# Patient Record
Sex: Female | Born: 1995 | Race: White | Hispanic: No | Marital: Single | Smoking: Current every day smoker
Health system: Southern US, Community
[De-identification: ages and names within clinical notes are randomized; demographics above are authoritative.]

## PROBLEM LIST (undated history)

## (undated) DIAGNOSIS — F988 Other specified behavioral and emotional disorders with onset usually occurring in childhood and adolescence: Secondary | ICD-10-CM

## (undated) DIAGNOSIS — K589 Irritable bowel syndrome without diarrhea: Secondary | ICD-10-CM

## (undated) HISTORY — DX: Irritable bowel syndrome, unspecified: K58.9

## (undated) HISTORY — DX: Other specified behavioral and emotional disorders with onset usually occurring in childhood and adolescence: F98.8

## (undated) HISTORY — PX: OTHER SURGICAL HISTORY: SHX169

---
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
## 2015-01-02 ENCOUNTER — Ambulatory Visit: Payer: Medicaid Other | Admitting: Physical Therapy

## 2015-01-10 ENCOUNTER — Ambulatory Visit: Payer: Medicaid Other | Admitting: Physical Therapy

## 2015-12-03 ENCOUNTER — Encounter: Payer: Self-pay | Admitting: Family Medicine

## 2015-12-03 ENCOUNTER — Ambulatory Visit (INDEPENDENT_AMBULATORY_CARE_PROVIDER_SITE_OTHER): Payer: Medicaid Other | Admitting: Family Medicine

## 2015-12-03 ENCOUNTER — Ambulatory Visit (INDEPENDENT_AMBULATORY_CARE_PROVIDER_SITE_OTHER): Payer: Medicaid Other

## 2015-12-03 VITALS — BP 122/70 | HR 87 | Temp 98.7°F | Ht 64.0 in | Wt 227.0 lb

## 2015-12-03 DIAGNOSIS — M25521 Pain in right elbow: Secondary | ICD-10-CM

## 2015-12-03 NOTE — Progress Notes (Signed)
   Subjective:  Patient ID: Kelly Morrison, female    DOB: 1996/01/30  Age: 20 y.o. MRN: [id number]  CC: Establish Care and Elbow Pain   HPI SHERRELLE PROCHAZKA presents for fell backwards while bending to take clothes out of the dryer. Fell backwards. Hit elbow. Onset yesterday. Very tender. Knot forming. Pain 5/10 at posterior elbow, hurts with flexion & extension. Moderate relief using ice packs. Did not take meds such as OTC analgesia.  History Kyli has a past medical history of Attention deficit disorder.   She has past surgical history that includes Knee arthroscopy with posterior cruciate ligament (pcl) reconstruction (Right, 2012); Tonsillectomy (2016); and attention deficit disoder.   Her family history includes ADD / ADHD in her brother; Cancer in her brother and father; Depression in her brother; Stroke in her mother; Tourette syndrome in her brother.She reports that she has been passively smoking.  She does not have any smokeless tobacco history on file. She reports that she does not drink alcohol or use illicit drugs.  Currently takes no medications   ROS Review of Systems  Constitutional: Negative for fever, activity change and appetite change.  HENT: Negative for congestion, rhinorrhea and sore throat.   Eyes: Negative for visual disturbance.  Respiratory: Negative for cough and shortness of breath.   Cardiovascular: Negative for chest pain and palpitations.  Gastrointestinal: Negative for nausea, abdominal pain and diarrhea.  Genitourinary: Negative for dysuria.  Musculoskeletal: Positive for joint swelling and arthralgias. Negative for myalgias.       Nml except elbow - see HPI. Previous knee injury has resolved    Objective:  BP 122/70 mmHg  Pulse 87  Temp(Src) 98.7 F (37.1 C) (Oral)  Ht  (1.626 m)  Wt 227 lb (102.967 kg)  BMI 38.95 kg/m2  SpO2 98%  LMP 08/02/2015 (Approximate)  Physical Exam  Constitutional: She is oriented to person, place, and  time. She appears well-developed and well-nourished. No distress.  HENT:  Head: Normocephalic and atraumatic.  Eyes: Conjunctivae are normal. Pupils are equal, round, and reactive to light.  Neck: Normal range of motion. Neck supple. No thyromegaly present.  Cardiovascular: Normal rate, regular rhythm and normal heart sounds.   No murmur heard. Pulmonary/Chest: Effort normal and breath sounds normal. No respiratory distress. She has no wheezes. She has no rales.  Abdominal: Soft. Bowel sounds are normal. She exhibits no distension. There is no tenderness.  Musculoskeletal: Normal range of motion. She exhibits edema and tenderness (posterior right elbow).  Lymphadenopathy:    She has no cervical adenopathy.  Neurological: She is alert and oriented to person, place, and time.  Skin: Skin is warm and dry.  Psychiatric: She has a normal mood and affect. Her behavior is normal. Judgment and thought content normal.    Assessment & Plan:   Ayannah was seen today for establish care and elbow pain.  Diagnoses and all orders for this visit:  Elbow pain, right -     DG Elbow 2 Views Right; Future -     POCT urine pregnancy   I am having Ms. Steen maintain her etonogestrel and ibuprofen.  Meds ordered this encounter  Medications  . etonogestrel (IMPLANON) 68 MG IMPL implant    Sig: by Subdermal route.  Marland Kitchen ibuprofen (ADVIL,MOTRIN) 400 MG tablet    Sig: Take 400 mg by mouth.   XR - no fx noted.  Follow-up: Return if symptoms worsen or fail to improve.  Mechele Claude, M.D.

## 2015-12-13 ENCOUNTER — Ambulatory Visit (INDEPENDENT_AMBULATORY_CARE_PROVIDER_SITE_OTHER): Payer: Medicaid Other | Admitting: Family Medicine

## 2015-12-13 ENCOUNTER — Encounter: Payer: Self-pay | Admitting: Family Medicine

## 2015-12-13 VITALS — BP 130/82 | HR 94 | Temp 98.0°F | Ht 64.0 in | Wt 228.8 lb

## 2015-12-13 DIAGNOSIS — Z3009 Encounter for other general counseling and advice on contraception: Secondary | ICD-10-CM

## 2015-12-13 DIAGNOSIS — N926 Irregular menstruation, unspecified: Secondary | ICD-10-CM

## 2015-12-13 NOTE — Patient Instructions (Signed)
Great to meet you!  Come back anytime.   Consider oral contraceptive pills when your nexplanon is finished (it lasts 3 years), [facility] will be able to tell you the date to get it removed.

## 2015-12-13 NOTE — Progress Notes (Signed)
   HPI  Patient presents today here to discuss irregular periods.  She explains that since getting her Nexplanon replaced about a year and a half ago she's had irregular periods. This is her second one, on the first she had regular periods She sexually active with one partner, does not use condoms due to latex allergy and the expense of nonlatex condoms.  She denies any concerns for STI's. She is engaged remarried.  She has questions about which contraception methods to use whenever her Medicaid runs out.  She is considering getting it removed b/c she is worried about teh cost of removal w/o insurance  She had a negative preg test at the [facility] yesterday  PMH: Smoking status noted ROS: Per HPI  Objective: BP 130/82 mmHg  Pulse 94  Temp(Src) 98 F (36.7 C) (Oral)  Ht  (1.626 m)  Wt 228 lb 12.8 oz (103.783 kg)  BMI 39.25 kg/m2  LMP 09/27/2015 Gen: NAD, alert, cooperative with exam HEENT: NCAT CV: RRR, good S1/S2, no murmur Resp: CTABL, no wheezes, non-labored Neuro: Alert and oriented, No gross deficits  Assessment and plan:  # Irregular periods, discusion about contraception Irregularity likely due to nexplanon Recommended discussing cost with [facility]. If she would like it removed shile she is covered I would be glad to do this and offer an Rx of sprintec.  Follow up as needed   Murtis Sink, MD [facility] 12/13/2015, 8:50 AM

## 2016-02-26 ENCOUNTER — Ambulatory Visit: Payer: Medicaid Other | Admitting: Family Medicine

## 2016-02-26 ENCOUNTER — Encounter (INDEPENDENT_AMBULATORY_CARE_PROVIDER_SITE_OTHER): Payer: Self-pay

## 2016-02-27 ENCOUNTER — Encounter: Payer: Self-pay | Admitting: Family Medicine

## 2016-03-18 ENCOUNTER — Ambulatory Visit (INDEPENDENT_AMBULATORY_CARE_PROVIDER_SITE_OTHER): Payer: Medicaid Other | Admitting: Physician Assistant

## 2016-03-18 ENCOUNTER — Encounter: Payer: Self-pay | Admitting: Physician Assistant

## 2016-03-18 VITALS — BP 112/67 | HR 87 | Temp 99.1°F | Ht 64.0 in | Wt 232.8 lb

## 2016-03-18 DIAGNOSIS — M545 Low back pain, unspecified: Secondary | ICD-10-CM

## 2016-03-18 DIAGNOSIS — M542 Cervicalgia: Secondary | ICD-10-CM | POA: Diagnosis not present

## 2016-03-18 MED ORDER — CYCLOBENZAPRINE HCL 10 MG PO TABS: 10.0000 mg | ORAL_TABLET | Freq: Three times a day (TID) | ORAL | Status: DC | PRN

## 2016-03-18 MED ORDER — MELOXICAM 15 MG PO TABS: 15.0000 mg | ORAL_TABLET | Freq: Every day | ORAL | Status: DC

## 2016-03-18 NOTE — Progress Notes (Signed)
Subjective:     Patient ID: Kelly Morrison, female   DOB: 06/30/1996, 20 y.o.   MRN: [id number]  HPI Pt slipped in th shower last pm Denies any LOC with the fall Pt with midline neck and low back pain Took one of her fathers 800mg  Ibuprofen Still with pain this am and needs note for work  Review of Systems  Denies any radiation of sx to the ext. No numbness or tingling noted    Objective:   Physical Exam Gait nl NAD FROM  of the C-spine but sx at all ext Shoulder shrug equal FROM of the UE ext  Strength to the upper ext equal bilat Pulses sensory good to UE + TTP of the post cerv region manly midline No palp muscle spasm FROM of the L-spine Sx at the ext Generalized TTP of the entire L-spine area No palp muscle spasm SLR neg Strength equal in the lower ext Sensory good DTR1+/= upper and lower ext    Assessment:     1. Midline low back pain without sciatica   2. Neck pain        Plan:     Heat/Ice Gentle stretching Massage Nl course reviewed Work note Mobic 15 mg q d x 10 days Flexeril 10mg  tid prn #21 SE reviewed F/U prn

## 2016-03-18 NOTE — Patient Instructions (Signed)

## 2016-03-26 ENCOUNTER — Encounter: Payer: Self-pay | Admitting: Family

## 2016-03-26 ENCOUNTER — Ambulatory Visit (INDEPENDENT_AMBULATORY_CARE_PROVIDER_SITE_OTHER): Payer: Medicaid Other | Admitting: Family

## 2016-03-26 VITALS — BP 132/89 | HR 110 | Temp 98.5°F | Ht 64.0 in | Wt 236.0 lb

## 2016-03-26 DIAGNOSIS — A084 Viral intestinal infection, unspecified: Secondary | ICD-10-CM | POA: Diagnosis not present

## 2016-03-26 DIAGNOSIS — Z6841 Body Mass Index (BMI) 40.0 and over, adult: Secondary | ICD-10-CM

## 2016-03-26 MED ORDER — ONDANSETRON HCL 4 MG PO TABS: 4.0000 mg | ORAL_TABLET | Freq: Three times a day (TID) | ORAL | Status: DC | PRN

## 2016-03-26 NOTE — Progress Notes (Signed)
   Subjective:    Patient ID: Kelly Morrison, female    DOB: 02/02/1996, 20 y.o.   MRN: [id number]  Emesis  This is a new problem. The current episode started in the past 7 days. The problem occurs 2 to 4 times per day. The problem has been unchanged. The emesis has an appearance of stomach contents. There has been no fever. Associated symptoms include chills, coughing, diarrhea, dizziness and headaches. Pertinent negatives include no chest pain or fever. She has tried bed rest and increased fluids for the symptoms. The treatment provided mild relief.  Diarrhea  This is a new problem. The current episode started in the past 7 days. The problem occurs 2 to 4 times per day. The problem has been unchanged. The stool consistency is described as watery. The patient states that diarrhea does not awaken her from sleep. Associated symptoms include chills, coughing, headaches and vomiting. Pertinent negatives include no bloating, fever or increased  flatus. Nothing aggravates the symptoms. She has tried increased fluids for the symptoms. The treatment provided mild relief.      Review of Systems  Constitutional: Positive for chills. Negative for fever.  Respiratory: Positive for cough.   Cardiovascular: Negative for chest pain.  Gastrointestinal: Positive for vomiting and diarrhea. Negative for bloating and flatus.  Neurological: Positive for dizziness and headaches.  All other systems reviewed and are negative.      Objective:   Physical Exam  Constitutional: She is oriented to person, place, and time. She appears well-developed and well-nourished. No distress.  HENT:  Head: Normocephalic and atraumatic.  Right Ear: External ear normal.  Left Ear: External ear normal.  Nose: Nose normal.  Mouth/Throat: Oropharynx is clear and moist.  Eyes: Pupils are equal, round, and reactive to light.  Neck: Normal range of motion. Neck supple. No thyromegaly present.  Cardiovascular: Normal rate, regular  rhythm, normal heart sounds and intact distal pulses.   No murmur heard. Pulmonary/Chest: Effort normal and breath sounds normal. No respiratory distress. She has no wheezes.  Abdominal: Soft. Bowel sounds are normal. She exhibits no distension. There is no tenderness.  Musculoskeletal: Normal range of motion. She exhibits no edema or tenderness.  Neurological: She is alert and oriented to person, place, and time. She has normal reflexes. No cranial nerve deficit.  Skin: Skin is warm and dry.  Psychiatric: She has a normal mood and affect. Her behavior is normal. Judgment and thought content normal.  Vitals reviewed.     BP 132/89 mmHg  Pulse 110  Temp(Src) 98.5 F (36.9 C) (Oral)  Ht 5\' 4"  (1.626 m)  Wt 236 lb (107.049 kg)  BMI 40.49 kg/m2  LMP  (Approximate)     Assessment & Plan:  1. Viral gastroenteritis -Force fluids -Bland diet -Zofran as needed, Imodium as needed RTO prn  - ondansetron (ZOFRAN) 4 MG tablet; Take 1 tablet (4 mg total) by mouth every 8 (eight) hours as needed for nausea or vomiting.  Dispense: 20 tablet; Refill: 0  2. Morbid obesity with BMI of 40.0-44.9, adult (HCC)  Jannifer Rodneyhristy Hazyl Marseille, FNP

## 2016-03-26 NOTE — Patient Instructions (Signed)

## 2016-04-22 ENCOUNTER — Ambulatory Visit (INDEPENDENT_AMBULATORY_CARE_PROVIDER_SITE_OTHER): Payer: Medicaid Other | Admitting: Physician Assistant

## 2016-04-22 ENCOUNTER — Encounter: Payer: Self-pay | Admitting: Physician Assistant

## 2016-04-22 VITALS — BP 123/83 | HR 81 | Temp 97.6°F | Ht 64.0 in | Wt 233.0 lb

## 2016-04-22 DIAGNOSIS — R5383 Other fatigue: Secondary | ICD-10-CM

## 2016-04-22 DIAGNOSIS — R358 Other polyuria: Secondary | ICD-10-CM | POA: Diagnosis not present

## 2016-04-22 DIAGNOSIS — R3589 Other polyuria: Secondary | ICD-10-CM

## 2016-04-22 NOTE — Progress Notes (Signed)
Subjective:     Patient ID: Kelly Morrison, female   DOB: 03/09/1996, 20 y.o.   MRN: [id number]  HPI Pt states she is having pregnancy sx of wgt gain, polyuria, nausea, and fatigue Taken several OTC preg test that have been indeterminate She has had Nexplanon for 2 yrs There is a FH of diabetes  Review of Systems  Constitutional: Positive for activity change, fatigue and unexpected weight change. Negative for fever, chills, diaphoresis and appetite change.  HENT: Negative.   Respiratory: Negative.   Cardiovascular: Negative.   Gastrointestinal: Positive for nausea and abdominal pain. Negative for vomiting, diarrhea and constipation.  Endocrine: Positive for polyuria. Negative for polydipsia.  Genitourinary: Positive for frequency. Negative for dysuria, urgency, hematuria, flank pain, decreased urine volume, vaginal discharge and difficulty urinating.       Objective:   Physical Exam  Constitutional: She appears well-developed and well-nourished.  HENT:  Right Ear: External ear normal.  Left Ear: External ear normal.  Nose: Nose normal.  Mouth/Throat: Oropharynx is clear and moist. No oropharyngeal exudate.  Neck: Normal range of motion. Neck supple. No thyromegaly present.  Cardiovascular: Normal rate, regular rhythm and normal heart sounds.   Pulmonary/Chest: Effort normal and breath sounds normal.  Abdominal: Soft. Bowel sounds are normal. She exhibits no distension and no mass. There is no tenderness. There is no rebound and no guarding.  Lymphadenopathy:    She has no cervical adenopathy.  Nursing note and vitals reviewed. CMP.CBC.Serum preg pending     Assessment:     1. Other fatigue   2. Polyuria        Plan:     Labs pending- will inform of results F/U pending labs Good diet Rest

## 2016-04-22 NOTE — Patient Instructions (Signed)
Fatigue  Fatigue is feeling tired all of the time, a lack of energy, or a lack of motivation. Occasional or mild fatigue is often a normal response to activity or life in general. However, long-lasting (chronic) or extreme fatigue may indicate an underlying medical condition.  HOME CARE INSTRUCTIONS   Watch your fatigue for any changes. The following actions may help to lessen any discomfort you are feeling:  · Talk to your health care provider about how much sleep you need each night. Try to get the required amount every night.  · Take medicines only as directed by your health care provider.  · Eat a healthy and nutritious diet. Ask your health care provider if you need help changing your diet.  · Drink enough fluid to keep your urine clear or pale yellow.  · Practice ways of relaxing, such as yoga, meditation, massage therapy, or acupuncture.  · Exercise regularly.    · Change situations that cause you stress. Try to keep your work and personal routine reasonable.  · Do not abuse illegal drugs.  · Limit alcohol intake to no more than 1 drink per day for nonpregnant women and 2 drinks per day for men. One drink equals 12 ounces of beer, 5 ounces of wine, or 1½ ounces of hard liquor.  · Take a multivitamin, if directed by your health care provider.  SEEK MEDICAL CARE IF:   · Your fatigue does not get better.  · You have a fever.    · You have unintentional weight loss or gain.  · You have headaches.    · You have difficulty:      Falling asleep.    Sleeping throughout the night.  · You feel angry, guilty, anxious, or sad.     · You are unable to have a bowel movement (constipation).    · You skin is dry.     · Your legs or another part of your body is swollen.    SEEK IMMEDIATE MEDICAL CARE IF:   · You feel confused.    · Your vision is blurry.  · You feel faint or pass out.    · You have a severe headache.    · You have severe abdominal, pelvic, or back pain.    · You have chest pain, shortness of breath, or an  irregular or fast heartbeat.    · You are unable to urinate or you urinate less than normal.    · You develop abnormal bleeding, such as bleeding from the rectum, vagina, nose, lungs, or nipples.  · You vomit blood.     · You have thoughts about harming yourself or committing suicide.    · You are worried that you might harm someone else.       This information is not intended to replace advice given to you by your health care provider. Make sure you discuss any questions you have with your health care provider.     Document Released: 08/16/2007 Document Revised: 11/09/2014 Document Reviewed: 02/20/2014  Elsevier Interactive Patient Education ©2016 [employer]

## 2016-04-23 LAB — CMP14+EGFR
A/G RATIO: 1.7 (ref 1.2–2.2)
ALBUMIN: 4.3 g/dL (ref 3.5–5.5)
ALK PHOS: 83 IU/L (ref 39–117)
ALT: 17 IU/L (ref 0–32)
AST: 17 IU/L (ref 0–40)
BILIRUBIN TOTAL: 0.4 mg/dL (ref 0.0–1.2)
BUN / CREAT RATIO: 15 (ref 9–23)
BUN: 13 mg/dL (ref 6–20)
CHLORIDE: 102 mmol/L (ref 96–106)
CO2: 24 mmol/L (ref 18–29)
Calcium: 9.1 mg/dL (ref 8.7–10.2)
Creatinine, Ser: 0.85 mg/dL (ref 0.57–1.00)
GFR calc non Af Amer: 99 mL/min/{1.73_m2} (ref >59)
GFR, EST AFRICAN AMERICAN: 114 mL/min/{1.73_m2} (ref >59)
GLOBULIN, TOTAL: 2.6 g/dL (ref 1.5–4.5)
GLUCOSE: 96 mg/dL (ref 65–99)
POTASSIUM: 4.3 mmol/L (ref 3.5–5.2)
SODIUM: 142 mmol/L (ref 134–144)
Total Protein: 6.9 g/dL (ref 6.0–8.5)

## 2016-04-23 LAB — CBC WITH DIFFERENTIAL/PLATELET
BASOS ABS: 0 10*3/uL (ref 0.0–0.2)
BASOS: 1 %
EOS (ABSOLUTE): 0.1 10*3/uL (ref 0.0–0.4)
Eos: 2 %
Hematocrit: 40.8 % (ref 34.0–46.6)
Hemoglobin: 13.4 g/dL (ref 11.1–15.9)
Immature Grans (Abs): 0 10*3/uL (ref 0.0–0.1)
Immature Granulocytes: 0 %
LYMPHS ABS: 2.2 10*3/uL (ref 0.7–3.1)
Lymphs: 36 %
MCH: 28.5 pg (ref 26.6–33.0)
MCHC: 32.8 g/dL (ref 31.5–35.7)
MCV: 87 fL (ref 79–97)
Monocytes Absolute: 0.6 10*3/uL (ref 0.1–0.9)
Monocytes: 9 %
NEUTROS ABS: 3.3 10*3/uL (ref 1.4–7.0)
Neutrophils: 52 %
PLATELETS: 273 10*3/uL (ref 150–379)
RBC: 4.7 x10E6/uL (ref 3.77–5.28)
RDW: 13.3 % (ref 12.3–15.4)
WBC: 6.3 10*3/uL (ref 3.4–10.8)

## 2016-04-23 LAB — BETA HCG QUANT (REF LAB)

## 2016-08-05 ENCOUNTER — Encounter: Payer: Self-pay | Admitting: Family Medicine

## 2016-08-05 ENCOUNTER — Ambulatory Visit (INDEPENDENT_AMBULATORY_CARE_PROVIDER_SITE_OTHER): Payer: Medicaid Other | Admitting: Family Medicine

## 2016-08-05 ENCOUNTER — Ambulatory Visit (INDEPENDENT_AMBULATORY_CARE_PROVIDER_SITE_OTHER): Payer: Medicaid Other

## 2016-08-05 VITALS — BP 118/73 | HR 85 | Temp 98.2°F | Ht 64.0 in | Wt 239.6 lb

## 2016-08-05 DIAGNOSIS — R1030 Lower abdominal pain, unspecified: Secondary | ICD-10-CM | POA: Diagnosis not present

## 2016-08-05 LAB — URINALYSIS, COMPLETE
Bilirubin, UA: NEGATIVE
GLUCOSE, UA: NEGATIVE
Ketones, UA: NEGATIVE
Nitrite, UA: NEGATIVE
PROTEIN UA: NEGATIVE
RBC, UA: NEGATIVE
Specific Gravity, UA: >1.03 — ABNORMAL HIGH (ref 1.005–1.030)
Urobilinogen, Ur: 0.2 mg/dL (ref 0.2–1.0)
pH, UA: 6 (ref 5.0–7.5)

## 2016-08-05 LAB — MICROSCOPIC EXAMINATION: Epithelial Cells (non renal): >10 /hpf — AB (ref 0–10)

## 2016-08-05 MED ORDER — POLYETHYLENE GLYCOL 3350 17 GM/SCOOP PO POWD: 17.0000 g | Freq: Two times a day (BID) | ORAL | 5 refills | Status: DC | PRN

## 2016-08-05 NOTE — Progress Notes (Signed)
Subjective:  Patient ID: Kelly Morrison, female    DOB: 10/11/1996  Age: 20 y.o. MRN: [id number]  CC: lower abdominal pain and Nausea   HPI Kelly Morrison presents for 3-4 days of increasing bilateral lower quadrant pain. She denies any vaginal discharge. She is sexually active but has an Implanon implant. Because of that her periods happen irregularly usually about once every 6 months. The most recent one was about 3 months ago. Additionally she denies any frequency of urination or dysuria.  History Kelly Morrison has a past medical history of Attention deficit disorder.   She has a past surgical history that includes Knee arthroscopy with posterior cruciate ligament (pcl) reconstruction (Right, 2012); Tonsillectomy (2016); and attention deficit disoder.   Her family history includes ADD / ADHD in her brother; Cancer in her brother and father; Depression in her brother; Stroke in her mother; Tourette syndrome in her brother.She reports that she is a non-smoker but has been exposed to tobacco smoke. She has never used smokeless tobacco. She reports that she does not drink alcohol or use drugs.  Current Outpatient Prescriptions on File Prior to Visit  Medication Sig Dispense Refill  . etonogestrel (IMPLANON) 68 MG IMPL implant by Subdermal route.     No current facility-administered medications on file prior to visit.     ROS Review of Systems  Constitutional: Negative for activity change, appetite change and fever.  HENT: Negative for congestion, rhinorrhea and sore throat.   Eyes: Negative for visual disturbance.  Respiratory: Negative for cough and shortness of breath.   Cardiovascular: Negative for chest pain and palpitations.  Gastrointestinal: Positive for abdominal pain and nausea. Negative for diarrhea.  Genitourinary: Negative for dysuria.  Musculoskeletal: Negative for arthralgias and myalgias.    Objective:  BP 118/73   Pulse 85   Temp 98.2 F (36.8 C) (Oral)   Ht 5'  4" (1.626 m)   Wt 239 lb 9.6 oz (108.7 kg)   BMI 41.13 kg/m   Physical Exam  Constitutional: She is oriented to person, place, and time. She appears well-developed and well-nourished. No distress.  HENT:  Head: Normocephalic and atraumatic.  Eyes: Conjunctivae are normal. Pupils are equal, round, and reactive to light.  Neck: Normal range of motion. Neck supple.  Cardiovascular: Normal rate, regular rhythm and normal heart sounds.   No murmur heard. Pulmonary/Chest: Effort normal and breath sounds normal. No respiratory distress. She has no wheezes. She has no rales.  Abdominal: Soft. Bowel sounds are normal. She exhibits no distension and no mass. There is tenderness (moderate [facility]). There is no rebound and no guarding.  Musculoskeletal: Normal range of motion.  Neurological: She is alert and oriented to person, place, and time.  Skin: Skin is warm and dry.  Psychiatric: She has a normal mood and affect. Her behavior is normal.    Assessment & Plan:   Kelly Morrison was seen today for lower abdominal pain and nausea.  Diagnoses and all orders for this visit:  Lower abdominal pain -     Urinalysis, Complete -     Urine culture -     DG Abd 2 Views; Future  Other orders -     polyethylene glycol powder (GLYCOLAX/MIRALAX) powder; Take 17 g by mouth 2 (two) times daily as needed for moderate constipation. For constipation   I am having Kelly Morrison start on polyethylene glycol powder. I am also having her maintain her etonogestrel.  Meds ordered this encounter  Medications  .  polyethylene glycol powder (GLYCOLAX/MIRALAX) powder    Sig: Take 17 g by mouth 2 (two) times daily as needed for moderate constipation. For constipation    Dispense:  3350 g    Refill:  5   Abdominal x-ray shows increased stool throughout the colon.  Follow-up: Return if symptoms worsen or fail to improve.  Mechele Claude, M.D.

## 2016-08-07 LAB — URINE CULTURE

## 2016-08-12 ENCOUNTER — Encounter: Payer: Self-pay | Admitting: Family Medicine

## 2016-08-12 ENCOUNTER — Ambulatory Visit (INDEPENDENT_AMBULATORY_CARE_PROVIDER_SITE_OTHER): Payer: Medicaid Other | Admitting: Family Medicine

## 2016-08-12 ENCOUNTER — Ambulatory Visit: Payer: Medicaid Other | Admitting: Family Medicine

## 2016-08-12 ENCOUNTER — Ambulatory Visit (INDEPENDENT_AMBULATORY_CARE_PROVIDER_SITE_OTHER): Payer: Medicaid Other

## 2016-08-12 VITALS — BP 116/77 | HR 86 | Temp 97.8°F | Ht 64.0 in | Wt 236.0 lb

## 2016-08-12 DIAGNOSIS — R1030 Lower abdominal pain, unspecified: Secondary | ICD-10-CM

## 2016-08-12 LAB — URINALYSIS, COMPLETE
BILIRUBIN UA: NEGATIVE
GLUCOSE, UA: NEGATIVE
KETONES UA: NEGATIVE
LEUKOCYTES UA: NEGATIVE
NITRITE UA: NEGATIVE
Protein, UA: NEGATIVE
SPEC GRAV UA: 1.02 (ref 1.005–1.030)
Urobilinogen, Ur: 0.2 mg/dL (ref 0.2–1.0)
pH, UA: 7 (ref 5.0–7.5)

## 2016-08-12 LAB — MICROSCOPIC EXAMINATION

## 2016-08-12 MED ORDER — LINACLOTIDE 290 MCG PO CAPS: 290.0000 ug | ORAL_CAPSULE | Freq: Every day | ORAL | 2 refills | Status: DC

## 2016-08-12 NOTE — Progress Notes (Signed)
Subjective:  Patient ID: Kelly Morrison, female    DOB: 06/01/1996  Age: [age] yOmer Jack.o. MRN: [id number]  CC: Abdominal Pain (pt here today c/o lower abdominal pain and bilateral side pain. )   HPI Kelly Morrison presents for Patient says the MiraLAX worked well at first. She had several large bowel movements. Unfortunately she has not had a bowel movement for 2 or 3 days now and her pain has returned. It is bilateral with perhaps a bit more on the left. There is a small amount of nausea. No vomiting, no heartburn. No diarrhea. Appetite remains good. Diet has not changed recently.   History Kelly Morrison has a past medical history of Attention deficit disorder.   She has a past surgical history that includes Knee arthroscopy with posterior cruciate ligament (pcl) reconstruction (Right, 2012); Tonsillectomy (2016); and attention deficit disoder.   Her family history includes ADD / ADHD in her brother; Cancer in her brother and father; Depression in her brother; Stroke in her mother; Tourette syndrome in her brother.She reports that she is a non-smoker but has been exposed to tobacco smoke. She has never used smokeless tobacco. She reports that she does not drink alcohol or use drugs.    ROS Review of Systems  Constitutional: Negative for activity change, appetite change and fever.  HENT: Negative for congestion, rhinorrhea and sore throat.   Eyes: Negative for visual disturbance.  Respiratory: Negative for cough and shortness of breath.   Cardiovascular: Negative for chest pain and palpitations.  Gastrointestinal: Positive for abdominal distention, abdominal pain, constipation and nausea. Negative for diarrhea and vomiting.  Genitourinary: Negative for dysuria.  Musculoskeletal: Negative for arthralgias and myalgias.    Objective:  BP 116/77   Pulse 86   Temp 97.8 F (36.6 C) (Oral)   Ht 5\' 4"  (1.626 m)   Wt 236 lb (107 kg)   BMI 40.51 kg/m   BP Readings from Last 3 Encounters:    08/12/16 116/77  08/05/16 118/73  04/22/16 123/83    Wt Readings from Last 3 Encounters:  08/12/16 236 lb (107 kg)  08/05/16 239 lb 9.6 oz (108.7 kg)  04/22/16 233 lb (105.7 kg)     Physical Exam  Constitutional: She is oriented to person, place, and time. She appears well-developed and well-nourished.  HENT:  Head: Normocephalic and atraumatic.  Cardiovascular: Normal rate and regular rhythm.   No murmur heard. Pulmonary/Chest: Effort normal and breath sounds normal.  Abdominal: Soft. Bowel sounds are normal. She exhibits no mass. There is tenderness (primarily at the periumbilical region just inferior & to  the left). There is no rebound and no guarding.  Musculoskeletal: Normal range of motion.  Neurological: She is alert and oriented to person, place, and time.  Skin: Skin is warm and dry.  Psychiatric: She has a normal mood and affect. Her behavior is normal.     Lab Results  Component Value Date   WBC 6.3 04/22/2016   HGB 12.9 11/14/2009   HCT 40.8 04/22/2016   PLT 273 04/22/2016   GLUCOSE 96 04/22/2016   ALT 17 04/22/2016   AST 17 04/22/2016   NA 142 04/22/2016   K 4.3 04/22/2016   CL 102 04/22/2016   CREATININE 0.85 04/22/2016   BUN 13 04/22/2016   CO2 24 04/22/2016    No results found.  Assessment & Plan:   Kelly Morrison was seen today for abdominal pain.  Diagnoses and all orders for this visit:  Lower abdominal pain -  Urinalysis, Complete -     DG Abd 1 View; Future  Other orders -     linaclotide (LINZESS) 290 MCG CAPS capsule; Take 1 capsule (290 mcg total) by mouth daily. -     Microscopic Examination  X-ray shows increased stool burden with excessive gas  I am having Ms. Bigler start on linaclotide. I am also having her maintain her etonogestrel and polyethylene glycol powder.  Meds ordered this encounter  Medications  . linaclotide (LINZESS) 290 MCG CAPS capsule    Sig: Take 1 capsule (290 mcg total) by mouth daily.    Dispense:  30  capsule    Refill:  2     Follow-up: Return in about 2 weeks (around 08/26/2016).  Mechele Claude, M.D.

## 2016-08-24 ENCOUNTER — Telehealth: Payer: Self-pay | Admitting: Family Medicine

## 2016-08-24 DIAGNOSIS — R1030 Lower abdominal pain, unspecified: Secondary | ICD-10-CM

## 2016-08-24 NOTE — Telephone Encounter (Signed)
Refer as requested

## 2016-08-26 ENCOUNTER — Ambulatory Visit: Payer: Medicaid Other | Admitting: Gastroenterology

## 2016-08-26 ENCOUNTER — Ambulatory Visit: Payer: Medicaid Other | Admitting: Family Medicine

## 2016-09-03 ENCOUNTER — Encounter: Payer: Self-pay | Admitting: Pediatrics

## 2016-09-03 ENCOUNTER — Ambulatory Visit (INDEPENDENT_AMBULATORY_CARE_PROVIDER_SITE_OTHER): Payer: Medicaid Other

## 2016-09-03 ENCOUNTER — Ambulatory Visit (INDEPENDENT_AMBULATORY_CARE_PROVIDER_SITE_OTHER): Payer: Medicaid Other | Admitting: Pediatrics

## 2016-09-03 VITALS — BP 130/86 | HR 95 | Temp 97.9°F | Resp 22 | Ht 64.0 in | Wt 234.4 lb

## 2016-09-03 DIAGNOSIS — H6501 Acute serous otitis media, right ear: Secondary | ICD-10-CM | POA: Diagnosis not present

## 2016-09-03 DIAGNOSIS — M25572 Pain in left ankle and joints of left foot: Secondary | ICD-10-CM

## 2016-09-03 MED ORDER — AZITHROMYCIN 250 MG PO TABS: ORAL_TABLET | ORAL | 0 refills | Status: DC

## 2016-09-03 NOTE — Patient Instructions (Signed)
Netipot with distilled water 2-3 times a day to clear out sinuses Or Normal saline nasal spray Flonase steroid nasal spray Anti-histamine such as cetirizine Ibuprofen 600mg  three times a day Tylenol as needed Lots of fluids

## 2016-09-03 NOTE — Progress Notes (Signed)
  Subjective:   Patient ID: Kelly Morrison, female    DOB: 09/07/1996, 20 y.o.   MRN: [id number] CC: Nasal Omer Jackongestion; Cough; and Epistaxis  HPI: Omer Jackaylor A Mogel is a 20 y.o. female presenting for Nasal Congestion; Cough; and Epistaxis  Started getting sick Friday, apprx 7 days ago Started with runny nose, coughing congestion Nothing has gotten any better Feels like she stays cold hasnt taken anything OTC No fevers Normal appetite Normal urination, no diarrhea, no abd pain  Rolled her L ankle yesterday missing a step at her house Has pain with weight bearing, prefers to walk on L ball of foot due to lateral L ankle pain Some swelling No bruising   Relevant past medical, surgical, family and social history reviewed. Allergies and medications reviewed and updated. History  Smoking Status  . Passive Smoke Exposure - Never Smoker  Smokeless Tobacco  . Never Used   ROS: Per HPI   Objective:    BP 130/86   Pulse 95   Temp 97.9 F (36.6 C) (Oral)   Resp (!) 22   Ht 5\' 4"  (1.626 m)   Wt 234 lb 6.4 oz (106.3 kg)   SpO2 96%   BMI 40.23 kg/m   Wt Readings from Last 3 Encounters:  09/03/16 234 lb 6.4 oz (106.3 kg)  08/12/16 236 lb (107 kg)  08/05/16 239 lb 9.6 oz (108.7 kg)    Gen: NAD, alert, cooperative with exam, NCAT EYES: EOMI, no conjunctival injection, or no icterus ENT:  R TM red with effusion, OP without erythema LYMPH: no cervical LAD CV: NRRR, normal S1/S2, no murmur, distal pulses 2+ b/l Resp: CTABL, no wheezes, normal WOB Abd: +BS, soft, NTND. no guarding or organomegaly Neuro: Alert and oriented MSK: L ankle with soft tissue swelling lateral side below and anterior to malleolus No bruising TTP in area of swelling No ttp over 5th metatarsal, along L fibula, anterior ankle Normal ROM Some pain with eversion against pressure, otherwise strength L ankle intact Pain with weight bearing, prefers to walk on ball of foot with gait  Assessment & Plan:    [name] was seen today for nasal congestion, cough and epistaxis.  Diagnoses and all orders for this visit:  Acute URI Discussed symptomatic care, neti pot, flonase  Right acute serous otitis media, recurrence not specified -     azithromycin (ZITHROMAX) 250 MG tablet; Take 2 the first day and then one each day after.  Pain of joint of left ankle and foot Xray without fracture Rest, ice, NSAIDs prn -     DG Ankle Complete Left; Future  Follow up plan: Return if symptoms worsen or fail to improve. Rex Krasarol Latorya Bautch, MD [facility]

## 2016-09-16 ENCOUNTER — Ambulatory Visit: Payer: Medicaid Other | Admitting: Family Medicine

## 2016-09-17 ENCOUNTER — Telehealth: Payer: Self-pay | Admitting: Family Medicine

## 2016-09-17 ENCOUNTER — Encounter: Payer: Self-pay | Admitting: Family Medicine

## 2016-09-18 NOTE — Telephone Encounter (Signed)
Called patient's home, mother answered, asked to speak with Marcena, she said she would get her and no one ever came to phone

## 2016-09-18 NOTE — Telephone Encounter (Signed)
lmtcb

## 2016-10-27 NOTE — Progress Notes (Deleted)
   Subjective:    Patient ID: Kelly Morrison, female    DOB: 06/29/1996, 20 y.o.   MRN: [id number]  HPI    Review of Systems     Objective:   Physical Exam        Assessment & Plan:

## 2016-10-28 ENCOUNTER — Ambulatory Visit: Payer: Medicaid Other | Admitting: Family Medicine

## 2016-10-29 ENCOUNTER — Ambulatory Visit (INDEPENDENT_AMBULATORY_CARE_PROVIDER_SITE_OTHER): Payer: Medicaid Other | Admitting: Family Medicine

## 2016-10-29 ENCOUNTER — Encounter: Payer: Self-pay | Admitting: Family Medicine

## 2016-10-29 VITALS — BP 116/80 | HR 82 | Temp 98.8°F | Ht 64.0 in | Wt 234.2 lb

## 2016-10-29 DIAGNOSIS — S93492A Sprain of other ligament of left ankle, initial encounter: Secondary | ICD-10-CM

## 2016-10-29 NOTE — Progress Notes (Signed)
BP 116/80   Pulse 82   Temp 98.8 F (37.1 C) (Oral)   Ht 5\' 4"  (1.626 m)   Wt 234 lb 4 oz (106.3 kg)   BMI 40.21 kg/m    Subjective:    Patient ID: Kelly Morrison, female    DOB: 01/19/1996, 20 y.o.   MRN: [id number]  HPI: Kelly Jackaylor A Crepeau is a 20 y.o. female presenting on 10/29/2016 for Left ankle pain (was seen by Dr. Oswaldo DoneVincent on 09/03/16 and [facility])   HPI Left ankle pain/sprain Patient comes in today because she's been having left ankle pain/she strained her ankle on 10/20/2016. [facility] where they did do an x-ray which came back negative for any acute bony abnormalities. She says since that time she still been having significant pain in that ankle and she was told to follow-up with us for any further needs. She has not been taking any ibuprofen or Aleve or Tylenol because she says she hasn't needed it too much but the pain has been significant. She says she has been doing some ice but not frequently. She says the pain worsens that she is on her feet for prolonged periods throughout the day. The pain is on the lateral aspect of the left ankle and is 5 out of 10.  Relevant past medical, surgical, family and social history reviewed and updated as indicated. Interim medical history since our last visit reviewed. Allergies and medications reviewed and updated.  Review of Systems  Constitutional: Negative for chills and fever.  Respiratory: Negative for chest tightness and shortness of breath.   Cardiovascular: Negative for chest pain and leg swelling.  Genitourinary: Negative for difficulty urinating and dysuria.  Musculoskeletal: Positive for arthralgias and joint swelling. Negative for back pain and gait problem.  Skin: Negative for rash.  Neurological: Negative for light-headedness and headaches.  Psychiatric/Behavioral: Negative for agitation and behavioral problems.  All other systems reviewed and are  negative.   Per HPI unless specifically indicated above      Objective:    BP 116/80   Pulse 82   Temp 98.8 F (37.1 C) (Oral)   Ht 5\' 4"  (1.626 m)   Wt 234 lb 4 oz (106.3 kg)   BMI 40.21 kg/m   Wt Readings from Last 3 Encounters:  10/29/16 234 lb 4 oz (106.3 kg)  09/03/16 234 lb 6.4 oz (106.3 kg)  08/12/16 236 lb (107 kg)    Physical Exam  Constitutional: She appears well-developed and well-nourished. No distress.  Eyes: Conjunctivae are normal.  Musculoskeletal: Normal range of motion.       Left foot: There is tenderness (Tenderness over the lateral ankle just anterior to her lateral malleolus on the left ankle) and swelling. There is normal range of motion, normal capillary refill, no crepitus, no deformity and no laceration.  Neurological: She is alert. Coordination normal.  Skin: Skin is warm and dry. No rash noted. She is not diaphoretic.  Psychiatric: She has a normal mood and affect. Her behavior is normal.  Nursing note and vitals reviewed.     Assessment & Plan:   Problem List Items Addressed This Visit    None    Visit Diagnoses    Sprain of anterior talofibular ligament of left ankle, initial encounter    -  Primary   Recommended icing and ibuprofen and stretching       Follow up plan: Return if symptoms worsen or fail  to improve.  Counseling provided for all of the vaccine components No orders of the defined types were placed in this encounter.   Arville CareJoshua Dettinger, MD [facility] 10/29/2016, 4:33 PM

## 2016-11-30 ENCOUNTER — Telehealth: Payer: Self-pay

## 2016-11-30 DIAGNOSIS — S93492A Sprain of other ligament of left ankle, initial encounter: Secondary | ICD-10-CM

## 2016-11-30 NOTE — Telephone Encounter (Signed)
Please refer as requested. I assume this is for physical therapy

## 2016-12-02 ENCOUNTER — Telehealth: Payer: Self-pay | Admitting: Family Medicine

## 2016-12-11 NOTE — Telephone Encounter (Signed)
Pt wants appt to discuss contraceptives appt scheduled

## 2016-12-15 ENCOUNTER — Ambulatory Visit (INDEPENDENT_AMBULATORY_CARE_PROVIDER_SITE_OTHER): Payer: Medicaid Other | Admitting: Family Medicine

## 2016-12-15 ENCOUNTER — Encounter: Payer: Self-pay | Admitting: Family Medicine

## 2016-12-15 VITALS — BP 124/72 | HR 83 | Temp 98.2°F | Ht 64.0 in | Wt 233.6 lb

## 2016-12-15 DIAGNOSIS — Z308 Encounter for other contraceptive management: Secondary | ICD-10-CM | POA: Diagnosis not present

## 2016-12-15 NOTE — Patient Instructions (Signed)
Great to see you!  You can call for an appointment to [facility], we have the referral ready.

## 2016-12-15 NOTE — Progress Notes (Signed)
   HPI  Patient presents today here to discuss contraception options.  Patient has been using Nexplanon for the last 5 years. She states that she would like a longer term solution without concern for weight gain.  She complains of 30+ pound weight gain since starting it. She also has irregular periods.  Patient is sexually active with one partner, she is engaged to be married and plans to have children around the age of 21 or [age]. Last Pap smear was 2 years ago.  PMH: Smoking status noted ROS: Per HPI  Objective: BP 124/72   Pulse 83   Temp 98.2 F (36.8 C) (Oral)   Ht 5\' 4"  (1.626 m)   Wt 233 lb 9.6 oz (106 kg)   BMI 40.10 kg/m  Gen: NAD, alert, cooperative with exam HEENT: NCAT Neck/Skin: Acanthosis CV: RRR, good S1/S2, no murmur Resp: CTABL, no wheezes, non-labored Ext: No edema, warm Neuro: Alert and oriented, No gross deficits  Assessment and plan:  # Encounter for contraception management. Discussed various contraception options including oral contraceptive pills, condoms, various IUDs, and Nexplanon. Patient states that she would like to have a Mirena IUD placed Referral written for GYN.     Orders Placed This Encounter  Procedures  . Ambulatory referral to Obstetrics / Gynecology    Referral Priority:   Routine    Referral Type:   Consultation    Referral Reason:   Specialty Services Required    Requested Specialty:   Obstetrics and Gynecology    Number of Visits Requested:   1     Murtis SinkSam Yamira Papa, MD [facility] 12/15/2016, 9:18 AM

## 2016-12-23 ENCOUNTER — Encounter: Payer: Medicaid Other | Admitting: Women's Health

## 2016-12-31 ENCOUNTER — Ambulatory Visit (INDEPENDENT_AMBULATORY_CARE_PROVIDER_SITE_OTHER): Payer: Medicaid Other | Admitting: Women's Health

## 2016-12-31 ENCOUNTER — Encounter: Payer: Self-pay | Admitting: Women's Health

## 2016-12-31 VITALS — BP 138/62 | HR 80 | Ht 65.0 in | Wt 231.0 lb

## 2016-12-31 DIAGNOSIS — Z3202 Encounter for pregnancy test, result negative: Secondary | ICD-10-CM | POA: Diagnosis not present

## 2016-12-31 DIAGNOSIS — M25572 Pain in left ankle and joints of left foot: Secondary | ICD-10-CM | POA: Diagnosis not present

## 2016-12-31 DIAGNOSIS — Z3049 Encounter for surveillance of other contraceptives: Secondary | ICD-10-CM

## 2016-12-31 DIAGNOSIS — Z3046 Encounter for surveillance of implantable subdermal contraceptive: Secondary | ICD-10-CM

## 2016-12-31 LAB — POCT URINE PREGNANCY: PREG TEST UR: NEGATIVE

## 2016-12-31 MED ORDER — NORETHIN-ETH ESTRAD-FE BIPHAS 1 MG-10 MCG / 10 MCG PO TABS: 1.0000 | ORAL_TABLET | Freq: Every day | ORAL | 11 refills | Status: DC

## 2016-12-31 NOTE — Patient Instructions (Addendum)
Keep the area clean and dry.  You can remove the big bandage in 24 hours, and the small steri-strip bandage in 3-5 days.  A back up method, such as condoms, should be used for two weeks.  Oral Contraception Use Oral contraceptive pills (OCPs) are medicines taken to prevent pregnancy. OCPs work by preventing the ovaries from releasing eggs. The hormones in OCPs also cause the cervical mucus to thicken, preventing the sperm from entering the uterus. The hormones also cause the uterine lining to become thin, not allowing a fertilized egg to attach to the inside of the uterus. OCPs are highly effective when taken exactly as prescribed. However, OCPs do not prevent sexually transmitted diseases (STDs). Safe sex practices, such as using condoms along with an OCP, can help prevent STDs. Before taking OCPs, you may have a physical exam and Pap test. Your health care provider may also order blood tests if necessary. Your health care provider will make sure you are a good candidate for oral contraception. Discuss with your health care provider the possible side effects of the OCP you may be prescribed. When starting an OCP, it can take 2 to 3 months for the body to adjust to the changes in hormone levels in your body. How to take oral contraceptive pills Your health care provider may advise you on how to start taking the first cycle of OCPs. Otherwise, you can:  Start on day 1 of your menstrual period. You will not need any backup contraceptive protection with this start time.  Start on the first Sunday after your menstrual period or the day you get your prescription. In these cases, you will need to use backup contraceptive protection for the first week.  Start the pill at any time of your cycle. If you take the pill within 5 days of the start of your period, you are protected against pregnancy right away. In this case, you will not need a backup form of birth control. If you start at any other time of your  menstrual cycle, you will need to use another form of birth control for 7 days. If your OCP is the type called a minipill, it will protect you from pregnancy after taking it for 2 days (48 hours). After you have started taking OCPs:  If you forget to take 1 pill, take it as soon as you remember. Take the next pill at the regular time.  If you miss 2 or more pills, call your health care provider because different pills have different instructions for missed doses. Use backup birth control until your next menstrual period starts.  If you use a 28-day pack that contains inactive pills and you miss 1 of the last 7 pills (pills with no hormones), it will not matter. Throw away the rest of the non-hormone pills and start a new pill pack. No matter which day you start the OCP, you will always start a new pack on that same day of the week. Have an extra pack of OCPs and a backup contraceptive method available in case you miss some pills or lose your OCP pack. Follow these instructions at home:  Do not smoke.  Always use a condom to protect against STDs. OCPs do not protect against STDs.  Use a calendar to mark your menstrual period days.  Read the information and directions that came with your OCP. Talk to your health care provider if you have questions. Contact a health care provider if:  You develop nausea and   and vomiting.  You have abnormal vaginal discharge or bleeding.  You develop a rash.  You miss your menstrual period.  You are losing your hair.  You need treatment for mood swings or depression.  You get dizzy when taking the OCP.  You develop acne from taking the OCP.  You become pregnant. Get help right away if:  You develop chest pain.  You develop shortness of breath.  You have an uncontrolled or severe headache.  You develop numbness or slurred speech.  You develop visual problems.  You develop pain, redness, and swelling in the legs. This information is not  intended to replace advice given to you by your health care provider. Make sure you discuss any questions you have with your health care provider. Document Released: 10/08/2011 Document Revised: 03/26/2016 Document Reviewed: 04/09/2013 Elsevier Interactive Patient Education  2017 [employer].  Levonorgestrel intrauterine device (IUD) What is this medicine? LEVONORGESTREL IUD (LEE voe nor jes trel) is a contraceptive (birth control) device. The device is placed inside the uterus by a healthcare professional. It is used to prevent pregnancy. This device can also be used to treat heavy bleeding that occurs during your period. This medicine may be used for other purposes; ask your health care provider or pharmacist if you have questions. COMMON BRAND NAME(S): Cameron Ali What should I tell my health care provider before I take this medicine? They need to know if you have any of these conditions: -abnormal Pap smear -cancer of the breast, uterus, or cervix -diabetes -endometritis -genital or pelvic infection now or in the past -have more than one sexual partner or your partner has more than one partner -heart disease -history of an ectopic or tubal pregnancy -immune system problems -IUD in place -liver disease or tumor -problems with blood clots or take blood-thinners -seizures -use intravenous drugs -uterus of unusual shape -vaginal bleeding that has not been explained -an unusual or allergic reaction to levonorgestrel, other hormones, silicone, or polyethylene, medicines, foods, dyes, or preservatives -pregnant or trying to get pregnant -breast-feeding How should I use this medicine? This device is placed inside the uterus by a health care professional. Talk to your pediatrician regarding the use of this medicine in children. Special care may be needed. Overdosage: If you think you have taken too much of this medicine contact a poison control center or emergency  room at once. NOTE: This medicine is only for you. Do not share this medicine with others. What if I miss a dose? This does not apply. Depending on the brand of device you have inserted, the device will need to be replaced every 3 to 5 years if you wish to continue using this type of birth control. What may interact with this medicine? Do not take this medicine with any of the following medications: -amprenavir -bosentan -fosamprenavir This medicine may also interact with the following medications: -aprepitant -armodafinil -barbiturate medicines for inducing sleep or treating seizures -bexarotene -boceprevir -griseofulvin -medicines to treat seizures like carbamazepine, ethotoin, felbamate, oxcarbazepine, phenytoin, topiramate -modafinil -pioglitazone -rifabutin -rifampin -rifapentine -some medicines to treat HIV infection like atazanavir, efavirenz, indinavir, lopinavir, nelfinavir, tipranavir, ritonavir -St. John's wort -warfarin This list may not describe all possible interactions. Give your health care provider a list of all the medicines, herbs, non-prescription drugs, or dietary supplements you use. Also tell them if you smoke, drink alcohol, or use illegal drugs. Some items may interact with your medicine. What should I watch for while using this medicine? Visit your  doctor or health care professional for regular check ups. See your doctor if you or your partner has sexual contact with others, becomes HIV positive, or gets a sexual transmitted disease. This product does not protect you against HIV infection (AIDS) or other sexually transmitted diseases. You can check the placement of the IUD yourself by reaching up to the top of your vagina with clean fingers to feel the threads. Do not pull on the threads. It is a good habit to check placement after each menstrual period. Call your doctor right away if you feel more of the IUD than just the threads or if you cannot feel the  threads at all. The IUD may come out by itself. You may become pregnant if the device comes out. If you notice that the IUD has come out use a backup birth control method like condoms and call your health care provider. Using tampons will not change the position of the IUD and are okay to use during your period. This IUD can be safely scanned with magnetic resonance imaging (MRI) only under specific conditions. Before you have an MRI, tell your healthcare provider that you have an IUD in place, and which type of IUD you have in place. What side effects may I notice from receiving this medicine? Side effects that you should report to your doctor or health care professional as soon as possible: -allergic reactions like skin rash, itching or hives, swelling of the face, lips, or tongue -fever, flu-like symptoms -genital sores -high blood pressure -no menstrual period for 6 weeks during use -pain, swelling, warmth in the leg -pelvic pain or tenderness -severe or sudden headache -signs of pregnancy -stomach cramping -sudden shortness of breath -trouble with balance, talking, or walking -unusual vaginal bleeding, discharge -yellowing of the eyes or skin Side effects that usually do not require medical attention (report to your doctor or health care professional if they continue or are bothersome): -acne -breast pain -change in sex drive or performance -changes in weight -cramping, dizziness, or faintness while the device is being inserted -headache -irregular menstrual bleeding within first 3 to 6 months of use -nausea This list may not describe all possible side effects. Call your doctor for medical advice about side effects. You may report side effects to FDA at 1-800-FDA-1088. Where should I keep my medicine? This does not apply. NOTE: This sheet is a summary. It may not cover all possible information. If you have questions about this medicine, talk to your doctor, pharmacist, or health  care provider.  2018 Elsevier/Gold Standard (2016-07-31 14:14:56)

## 2016-12-31 NOTE — Progress Notes (Signed)
Kelly Morrison is a 21 y.o. year old Caucasian female here for Nexplanon removal.  Patient given informed consent for removal of her Nexplanon. She wants to start coc's for 1-562mths 'to clear system' then wants IUD. Turns 21yo in 4d, needs pap. Does not smoke, no h/o HTN, DVT/PE, CVA, MI, or migraines w/ aura.   BP 138/62 (BP Location: Right Arm, Patient Position: Sitting, Cuff Size: Large)   Pulse 80   Ht 5\' 5"  (1.651 m)   Wt 231 lb (104.8 kg)   LMP 12/27/2016   BMI 38.44 kg/m   Appropriate time out taken. Nexplanon site identified.  Area prepped in usual sterile fashon. One cc of 2% lidocaine was used to anesthetize the area at the distal end of the implant. A small stab incision was made right beside the implant on the distal portion.  The Nexplanon rod was grasped using hemostats and removed without difficulty.  There was less than 3 cc blood loss. There were no complications.  Steri-strips were applied over the small incision and a pressure bandage was applied.  The patient tolerated the procedure well.  She was instructed to keep the area clean and dry, remove pressure bandage in 24 hours, and keep insertion site covered with the steri-strip for 3-5 days.    Rx LoLoestrin, use condoms as back up x 2wks F/U in 2wks for pap & physical, will schedule IUD after that  Marge DuncansBooker, Aundrea Horace Randall CNM, [facility] 12/31/2016 12:22 PM

## 2017-01-11 ENCOUNTER — Other Ambulatory Visit: Payer: Medicaid Other | Admitting: Women's Health

## 2017-01-15 ENCOUNTER — Other Ambulatory Visit: Payer: Medicaid Other | Admitting: Women's Health

## 2017-01-19 ENCOUNTER — Other Ambulatory Visit (HOSPITAL_COMMUNITY)
Admission: RE | Admit: 2017-01-19 | Discharge: 2017-01-19 | Disposition: A | Discharge status: Home / Self Care | Payer: Medicaid Other | Source: Ambulatory Visit | Attending: Adult Health | Admitting: Adult Health

## 2017-01-19 ENCOUNTER — Ambulatory Visit (INDEPENDENT_AMBULATORY_CARE_PROVIDER_SITE_OTHER): Payer: Medicaid Other | Admitting: Adult Health

## 2017-01-19 ENCOUNTER — Encounter: Payer: Self-pay | Admitting: Adult Health

## 2017-01-19 VITALS — BP 116/72 | HR 74 | Ht 64.5 in | Wt 231.0 lb

## 2017-01-19 DIAGNOSIS — Z124 Encounter for screening for malignant neoplasm of cervix: Secondary | ICD-10-CM

## 2017-01-19 DIAGNOSIS — F32A Depression, unspecified: Secondary | ICD-10-CM

## 2017-01-19 DIAGNOSIS — Z01419 Encounter for gynecological examination (general) (routine) without abnormal findings: Secondary | ICD-10-CM

## 2017-01-19 DIAGNOSIS — F329 Major depressive disorder, single episode, unspecified: Secondary | ICD-10-CM

## 2017-01-19 DIAGNOSIS — Z0000 Encounter for general adult medical examination without abnormal findings: Secondary | ICD-10-CM

## 2017-01-19 MED ORDER — ESCITALOPRAM OXALATE 10 MG PO TABS: 10.0000 mg | ORAL_TABLET | Freq: Every day | ORAL | 6 refills | Status: DC
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
## 2017-01-21 LAB — CYTOLOGY - PAP
Chlamydia: NEGATIVE
Diagnosis: NEGATIVE
Neisseria Gonorrhea: NEGATIVE

## 2017-02-15 ENCOUNTER — Emergency Department (HOSPITAL_COMMUNITY): Payer: Medicaid Other

## 2017-02-15 ENCOUNTER — Encounter (HOSPITAL_COMMUNITY): Payer: Self-pay | Admitting: Emergency Medicine

## 2017-02-15 ENCOUNTER — Emergency Department (HOSPITAL_COMMUNITY)
Admission: EM | Admit: 2017-02-15 | Discharge: 2017-02-15 | Disposition: A | Discharge status: Home / Self Care | Payer: Medicaid Other | Attending: Emergency Medicine | Admitting: Emergency Medicine

## 2017-02-15 DIAGNOSIS — Z91040 Latex allergy status: Secondary | ICD-10-CM | POA: Insufficient documentation

## 2017-02-15 DIAGNOSIS — J069 Acute upper respiratory infection, unspecified: Secondary | ICD-10-CM

## 2017-02-15 DIAGNOSIS — Z79899 Other long term (current) drug therapy: Secondary | ICD-10-CM | POA: Insufficient documentation

## 2017-02-15 DIAGNOSIS — Z7722 Contact with and (suspected) exposure to environmental tobacco smoke (acute) (chronic): Secondary | ICD-10-CM | POA: Insufficient documentation

## 2017-02-15 MED ORDER — PROMETHAZINE-DM 6.25-15 MG/5ML PO SYRP: 5.0000 mL | ORAL_SOLUTION | Freq: Four times a day (QID) | ORAL | 0 refills | Status: DC | PRN

## 2017-02-15 MED ORDER — IBUPROFEN 600 MG PO TABS: 600.0000 mg | ORAL_TABLET | Freq: Four times a day (QID) | ORAL | 0 refills | Status: DC

## 2017-02-15 MED ORDER — LORATADINE-PSEUDOEPHEDRINE ER 5-120 MG PO TB12: 1.0000 | ORAL_TABLET | Freq: Two times a day (BID) | ORAL | 0 refills | Status: DC

## 2017-02-15 NOTE — ED Triage Notes (Signed)
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  Yesterday began coughing with a sore throat.  Denies taking meds pta.

## 2017-02-15 NOTE — ED Provider Notes (Signed)
[facility]-EMER[facility]Y DEPT Provider Note   CSN: [id number] Arrival date & time: 02/15/17  1100  By signing my name below, I, Cynda Acres, attest that this documentation has been prepared under the direction and in the presence of [employer]. Electronically Signed: Cynda Acres, Scribe. 02/15/17. 3:08 PM.  History   Chief Complaint Chief Complaint  Patient presents with  . Cough   HPI Comments: SWEET JARVIS is a 21 y.o. female with no pertinent medical history, who presents to the Emergency Department complaining of a sudden-onset, persistent cough that began 3 days ago. Patient states 3 of her co-workers are out with the flu. Patient reports associated sore throat, chills, nasal congestion, coarse voice, and rhinorrhea. No modifying factors indicated. Patient denies any fever, nausea, vomiting, diarrhea, or hemoptysis.  The history is provided by the patient. No language interpreter was used.    Past Medical History:  Diagnosis Date  . Attention deficit disorder    DCed vyvanse at age 29  . IBS (irritable bowel syndrome)     Patient Active Problem List   Diagnosis Date Noted  . Nexplanon removal 12/31/2016  . Fatigue 04/22/2016  . Morbid obesity with BMI of 40.0-44.9, adult (HCC) 03/26/2016  . Posterior cruciate tear 02/22/2012  . ADD (attention deficit disorder) 11/25/2011  . Knee joint laxity 09/21/2011  . 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 attention deficit disoder     stopped vyvanse 30 mg when she turned 18  . KNEE ARTHROSCOPY WITH POSTERIOR CRUCIATE LIGAMENT (PCL) RECONSTRUCTION Right 2012  . TONSILLECTOMY  2016    OB History    Gravida Para Term Preterm AB Living   0 0 0 0 0 0   SAB TAB Ectopic Multiple Live Births   0 0 0 0 0       Home Medications    Prior to Admission medications   Medication Sig Start Date End Date Taking? Authorizing Provider  escitalopram (LEXAPRO) 10 MG tablet  Take 1 tablet (10 mg total) by mouth daily. 01/19/17   Adline Potter, NP  Norethindrone-Ethinyl Estradiol-Fe Biphas (LO LOESTRIN FE) 1 MG-10 MCG / 10 MCG tablet Take 1 tablet by mouth daily. 12/31/16   Cheral Marker, CNM  polyethylene glycol powder (GLYCOLAX/MIRALAX) powder Take 17 g by mouth 2 (two) times daily as needed for moderate constipation. For constipation Patient taking differently: Take 17 g by mouth as needed for moderate constipation. For constipation 08/05/16   Mechele Claude, MD    Family History Family History  Problem Relation Age of Onset  . Stroke Mother   . Cancer Father   . Depression Brother   . Tourette syndrome Brother   . ADD / ADHD Brother   . Cancer Brother   . Cancer Paternal Grandfather   . Cancer Paternal Grandmother   . Diabetes Maternal Grandmother     Social History Social History  Substance Use Topics  . Smoking status: Passive Smoke Exposure - Never Smoker    Types: Cigarettes  . Smokeless tobacco: Never Used  . Alcohol use 0.0 oz/week     Comment: occasional     Allergies   Cefprozil and Latex   Review of Systems Review of Systems  Constitutional: Positive for chills. Negative for fever.  HENT: Positive for congestion, rhinorrhea, sore throat and voice change.   Respiratory: Positive for cough.   Gastrointestinal: Negative for diarrhea, nausea and vomiting.  All other systems reviewed and are  negative.    Physical Exam Updated Vital Signs BP 128/77 (BP Location: Right Arm)   Pulse 69   Temp 98.3 F (36.8 C) (Oral)   Resp 18   Ht  (1.651 m)   Wt 230 lb (104.3 kg)   LMP 02/01/2017   SpO2 99%   BMI 38.27 kg/m   Physical Exam  Constitutional: She is oriented to person, place, and time. She appears well-developed.  HENT:  Head: Normocephalic and atraumatic.  Mouth/Throat: Oropharynx is clear and moist.  Airway patent. Uvula midline.   Eyes: Conjunctivae and EOM are normal. Pupils are equal, round, and reactive to  light.  Neck: Normal range of motion. Neck supple.  Cardiovascular: Normal rate and regular rhythm.   Pulmonary/Chest: Effort normal. She has no wheezes. She has no rales.  symmetrical rise and fall of the chest with few scattered rhonchi.   Abdominal: Soft. Bowel sounds are normal.  Bowel sounds are active.   Musculoskeletal: Normal range of motion.  Capillary refill is less than two seconds.   Lymphadenopathy:    She has no cervical adenopathy.  Neurological: She is alert and oriented to person, place, and time.  Skin: Skin is warm and dry. No rash noted.  No rash of the palms.   Psychiatric: She has a normal mood and affect.  Nursing note and vitals reviewed.    ED Treatments / Results  DIAGNOSTIC STUDIES: Oxygen Saturation is 99% on RA, normal by my interpretation.    COORDINATION OF CARE: 3:08 PM Discussed treatment plan with pt at bedside and pt agreed to plan, which includes ibuprofen, Claritin D, and promethazine DM  Labs (all labs ordered are listed, but only abnormal results are displayed) Labs Reviewed - No data to display  EKG  EKG Interpretation None       Radiology Dg Chest 2 View  Result Date: 02/15/2017 CLINICAL DATA:  Productive cough and congestion since yesterday. EXAM: CHEST  2 VIEW COMPARISON:  PA and lateral chest 11/14/2009. FINDINGS: The lungs are clear. Heart size is normal. No pneumothorax or pleural effusion. No bony abnormality. IMPRESSION: Normal chest. Electronically Signed   By: Drusilla Kanner M.D.   On: 02/15/2017 11:33    Procedures Procedures (including critical care time)  Medications Ordered in ED Medications - No data to display   Initial Impression / Assessment and Plan / ED Course  I have reviewed the triage vital signs and the nursing notes.  Pertinent labs & imaging results that were available during my care of the patient were reviewed by me and considered in my medical decision making (see chart for details).       Final Clinical Impressions(s) / ED Diagnoses   MDM:  Pt presents with upper respiratory symptoms over the last 2-3 days. Oxygen level is within normal limits. Chest x-ray is negative for acute problem. Suspect URI. Pt will be treated with ibuprofen, Claritin D, and promethazine DM. Pt stable for D/C. He will return to the ED if any changes or problem.  Final diagnoses:  Acute upper respiratory infection    New Prescriptions New Prescriptions   No medications on file  **I personally performed the services described in this documentation, which was scribed in my presence. The recorded information has been reviewed and is accurate.    Ivery Quale, PA-C 02/16/17 1501    Loren Racer, MD 02/23/17 [phone]

## 2017-02-24 ENCOUNTER — Telehealth: Payer: Self-pay | Admitting: Adult Health

## 2017-02-24 MED ORDER — CITALOPRAM HYDROBROMIDE 20 MG PO TABS
20.0000 mg | ORAL_TABLET | Freq: Every day | ORAL | 6 refills | Status: DC
Start: 2017-02-24 — End: 2017-05-19

## 2017-02-24 NOTE — Telephone Encounter (Signed)
lexapro works good, but lost medicaid and it costs $65, wants something cheaper, will Rx Celexa 20 mg

## 2017-02-24 NOTE — Addendum Note (Signed)
Addended by: Cyril Mourning A on: 02/24/2017 11:43 AM   Modules accepted: Orders

## 2017-02-24 NOTE — Telephone Encounter (Signed)
Left message I Called, can get cheaper meds, but call me back with update on how lexapro is working.

## 2017-03-22 ENCOUNTER — Ambulatory Visit: Payer: Medicaid Other | Admitting: Adult Health

## 2017-05-18 ENCOUNTER — Ambulatory Visit: Payer: Self-pay | Admitting: Adult Health

## 2017-05-19 ENCOUNTER — Ambulatory Visit (INDEPENDENT_AMBULATORY_CARE_PROVIDER_SITE_OTHER): Payer: Medicaid Other | Admitting: Adult Health

## 2017-05-19 ENCOUNTER — Encounter: Payer: Self-pay | Admitting: Adult Health

## 2017-05-19 VITALS — BP 120/80 | HR 76 | Ht 65.0 in | Wt 228.0 lb

## 2017-05-19 DIAGNOSIS — F32A Depression, unspecified: Secondary | ICD-10-CM | POA: Insufficient documentation

## 2017-05-19 DIAGNOSIS — F329 Major depressive disorder, single episode, unspecified: Secondary | ICD-10-CM

## 2017-05-19 DIAGNOSIS — Z308 Encounter for other contraceptive management: Secondary | ICD-10-CM | POA: Diagnosis not present

## 2017-05-19 MED ORDER — CITALOPRAM HYDROBROMIDE 20 MG PO TABS: 20.0000 mg | ORAL_TABLET | Freq: Every day | ORAL | 6 refills | Status: DC

## 2017-05-19 NOTE — Progress Notes (Signed)
Subjective:     Patient ID: Kelly Morrison, female   DOB: 06/13/1996, 21 y.o.   MRN: [id number]  HPI [name] is a 21 year old white female in to discuss getting nexplanon, has had before and wanted to try the pill and last period heavy, and wants nexplanon.She has not started celexa yet, but will get today.   Review of Systems Last period heavy,wants nexplanon  Reviewed past medical,surgical, social and family history. Reviewed medications and allergies.     Objective:   Physical Exam BP 120/80 (BP Location: Left Arm, Patient Position: Sitting, Cuff Size: Large)   Pulse 76   Ht 5\' 5"  (1.651 m)   Wt 228 lb (103.4 kg)   LMP 05/11/2017   BMI 37.94 kg/m    PHQ 9 score 7,denies any suicidal ideations. Talk only, she has not gotten celexa from drug store yet, so has not taken, she wants nexplanon.   Assessment:     1. Encounter for other contraceptive management   2. Depression, unspecified depression type       Plan:     Continue lo loestrin Order nexaplanon No sex for 2 weeks prior to insertion, and if not on period, [facility] Return about 8/8 for nexplanon insertion Get celexa from drug store and take 1 daily

## 2017-06-09 ENCOUNTER — Ambulatory Visit (INDEPENDENT_AMBULATORY_CARE_PROVIDER_SITE_OTHER): Payer: Medicaid Other | Admitting: Adult Health

## 2017-06-09 VITALS — BP 110/60 | HR 76 | Ht 65.0 in | Wt 227.0 lb

## 2017-06-09 DIAGNOSIS — Z3049 Encounter for surveillance of other contraceptives: Secondary | ICD-10-CM | POA: Diagnosis not present

## 2017-06-09 DIAGNOSIS — Z30017 Encounter for initial prescription of implantable subdermal contraceptive: Secondary | ICD-10-CM

## 2017-06-10 ENCOUNTER — Encounter: Payer: Self-pay | Admitting: Adult Health

## 2017-06-10 DIAGNOSIS — Z30017 Encounter for initial prescription of implantable subdermal contraceptive: Secondary | ICD-10-CM | POA: Insufficient documentation

## 2017-06-10 MED ORDER — ETONOGESTREL 68 MG ~~LOC~~ IMPL
68.0000 mg | DRUG_IMPLANT | Freq: Once | SUBCUTANEOUS | Status: AC
  Administered 2017-06-09: 68 mg via SUBCUTANEOUS

## 2017-06-10 NOTE — Progress Notes (Addendum)
Subjective:     Patient ID: Kelly Morrison A Birr, female   DOB: 10/26/1996, 21 y.o.   MRN: [id number]  HPI Kelly Morrison is a 21 year old white female in for nexplanon insertion.  Review of Systems For nexplanon insertion  Reviewed past medical,surgical, social and family history. Reviewed medications and allergies.     Objective:   Physical Exam BP 110/60 (BP Location: Left Arm, Patient Position: Sitting, Cuff Size: Normal)   Pulse 76   Ht 5\' 5"  (1.651 m)   Wt 227 lb (103 kg)   LMP 06/03/2017   BMI 37.77 kg/m UPT negative. Consent signed, time out called. Left arm cleansed with betadine, and injected with 1.5 cc 1% lidocaine and waited til numb. Nexplanon easily inserted and steri strips applied.Rod easily palpated by provider and pt. Pressure dressing applied.    Assessment:     1. Nexplanon insertion   lot [id number], exp 1/21     Plan:     Use condoms x 2 weeks, keep clean and dry x 24 hours, no heavy lifting, keep steri strips on x 72 hours, Keep pressure dressing on x 24 hours. Follow up prn problems.

## 2017-06-10 NOTE — Patient Instructions (Addendum)
Use condoms x 2 weeks, keep clean and dry x 24 hours, no heavy lifting, keep steri strips on x 72 hours, Keep pressure dressing on x 24 hours. Follow up prn problems.  

## 2017-06-10 NOTE — Addendum Note (Signed)
Addended by: Federico FlakeNES, Avaline Stillson A on: 06/10/2017 09:08 AM   Modules accepted: Orders

## 2018-08-22 ENCOUNTER — Encounter: Payer: Self-pay | Admitting: Family Medicine

## 2018-08-24 ENCOUNTER — Encounter: Payer: Self-pay | Admitting: Family Medicine

## 2018-09-12 ENCOUNTER — Encounter: Payer: Medicaid Other | Admitting: Family Medicine

## 2018-09-13 ENCOUNTER — Ambulatory Visit (INDEPENDENT_AMBULATORY_CARE_PROVIDER_SITE_OTHER): Payer: Medicaid Other | Admitting: Family Medicine

## 2018-09-13 ENCOUNTER — Encounter: Payer: Self-pay | Admitting: Family Medicine

## 2018-09-13 VITALS — BP 129/85 | HR 88 | Temp 98.9°F | Ht 65.0 in | Wt 208.8 lb

## 2018-09-13 DIAGNOSIS — Z308 Encounter for other contraceptive management: Secondary | ICD-10-CM | POA: Diagnosis not present

## 2018-09-13 DIAGNOSIS — N938 Other specified abnormal uterine and vaginal bleeding: Secondary | ICD-10-CM | POA: Insufficient documentation

## 2018-09-13 DIAGNOSIS — N83209 Unspecified ovarian cyst, unspecified side: Secondary | ICD-10-CM | POA: Insufficient documentation

## 2018-09-13 DIAGNOSIS — F331 Major depressive disorder, recurrent, moderate: Secondary | ICD-10-CM

## 2018-09-13 DIAGNOSIS — Z23 Encounter for immunization: Secondary | ICD-10-CM

## 2018-09-13 DIAGNOSIS — Z01419 Encounter for gynecological examination (general) (routine) without abnormal findings: Secondary | ICD-10-CM

## 2018-09-13 DIAGNOSIS — Z113 Encounter for screening for infections with a predominantly sexual mode of transmission: Secondary | ICD-10-CM | POA: Diagnosis not present

## 2018-09-13 LAB — WET PREP FOR TRICH, YEAST, CLUE
Clue Cell Exam: NEGATIVE
Trichomonas Exam: NEGATIVE
YEAST EXAM: NEGATIVE

## 2018-09-13 MED ORDER — CITALOPRAM HYDROBROMIDE 20 MG PO TABS: 20.0000 mg | ORAL_TABLET | Freq: Every day | ORAL | 1 refills | Status: DC

## 2018-09-14 LAB — CMP14+EGFR
ALBUMIN: 4.4 g/dL (ref 3.5–5.5)
ALT: 16 IU/L (ref 0–32)
AST: 17 IU/L (ref 0–40)
Albumin/Globulin Ratio: 1.6 (ref 1.2–2.2)
Alkaline Phosphatase: 80 IU/L (ref 39–117)
BILIRUBIN TOTAL: 0.5 mg/dL (ref 0.0–1.2)
BUN / CREAT RATIO: 13 (ref 9–23)
BUN: 12 mg/dL (ref 6–20)
CALCIUM: 9.3 mg/dL (ref 8.7–10.2)
CO2: 23 mmol/L (ref 20–29)
Chloride: 103 mmol/L (ref 96–106)
Creatinine, Ser: 0.96 mg/dL (ref 0.57–1.00)
GFR, EST AFRICAN AMERICAN: 97 mL/min/{1.73_m2} (ref >59)
GFR, EST NON AFRICAN AMERICAN: 84 mL/min/{1.73_m2} (ref >59)
GLUCOSE: 77 mg/dL (ref 65–99)
Globulin, Total: 2.8 g/dL (ref 1.5–4.5)
Potassium: 4 mmol/L (ref 3.5–5.2)
Sodium: 142 mmol/L (ref 134–144)
TOTAL PROTEIN: 7.2 g/dL (ref 6.0–8.5)

## 2018-09-14 LAB — LIPID PANEL
CHOL/HDL RATIO: 3 ratio (ref 0.0–4.4)
Cholesterol, Total: 146 mg/dL (ref 100–199)
HDL: 49 mg/dL (ref >39)
LDL CALC: 80 mg/dL (ref 0–99)
Triglycerides: 85 mg/dL (ref 0–149)
VLDL CHOLESTEROL CAL: 17 mg/dL (ref 5–40)

## 2018-09-14 LAB — CBC WITH DIFFERENTIAL/PLATELET
BASOS ABS: 0 10*3/uL (ref 0.0–0.2)
BASOS: 1 %
EOS (ABSOLUTE): 0.1 10*3/uL (ref 0.0–0.4)
Eos: 2 %
HEMOGLOBIN: 13.4 g/dL (ref 11.1–15.9)
Hematocrit: 39.2 % (ref 34.0–46.6)
IMMATURE GRANS (ABS): 0 10*3/uL (ref 0.0–0.1)
Immature Granulocytes: 0 %
LYMPHS: 24 %
Lymphocytes Absolute: 1.9 10*3/uL (ref 0.7–3.1)
MCH: 30.1 pg (ref 26.6–33.0)
MCHC: 34.2 g/dL (ref 31.5–35.7)
MCV: 88 fL (ref 79–97)
MONOCYTES: 7 %
Monocytes Absolute: 0.5 10*3/uL (ref 0.1–0.9)
NEUTROS ABS: 5.2 10*3/uL (ref 1.4–7.0)
Neutrophils: 66 %
Platelets: 289 10*3/uL (ref 150–450)
RBC: 4.45 x10E6/uL (ref 3.77–5.28)
RDW: 11.7 % — ABNORMAL LOW (ref 12.3–15.4)
WBC: 7.8 10*3/uL (ref 3.4–10.8)

## 2018-09-14 LAB — STD SCREEN (8)
HEP B S AG: NEGATIVE
HIV SCREEN 4TH GENERATION: NONREACTIVE
HSV 1 Glycoprotein G Ab, IgG: 60.7 index — ABNORMAL HIGH (ref 0.00–0.90)
HSV 2 IGG, TYPE SPEC: 1.11 {index} — AB (ref 0.00–0.90)
Hep A IgM: NEGATIVE
Hep B C IgM: NEGATIVE
Hep C Virus Ab: <0.1 s/co ratio (ref 0.0–0.9)
RPR: NONREACTIVE

## 2018-09-14 LAB — HSV-2 IGG SUPPLEMENTAL TEST: HSV-2 IgG Supplemental Test: POSITIVE — AB

## 2018-09-15 ENCOUNTER — Encounter: Payer: Self-pay | Admitting: Family Medicine

## 2018-09-15 LAB — GC/CHLAMYDIA PROBE AMP
Chlamydia trachomatis, NAA: NEGATIVE
Neisseria gonorrhoeae by PCR: NEGATIVE

## 2018-09-15 NOTE — Progress Notes (Signed)
Subjective:  Patient ID: Kelly Morrison, female    DOB: [birth year]  Age: 22 y.o. MRN: [id number]  CC: Annual Exam and STD check   HPI Kelly Morrison presents for annual exam and feels a lot of depression. Wanting to stay alone. Says she went "wild" when her boyfriend left. Has had 4 partners recently. Needs STD testing.    Depression screen [facility] 2/9 09/13/2018 05/19/2017 01/19/2017  Decreased Interest _0 Down, Depressed, Hopeless 2 2 0  PHQ - 2 Score _1 Altered sleeping _2 Tired, decreased energy _3 Change in appetite _4 Feeling bad or failure about yourself  3 0 0  Trouble concentrating 2 0 0  Moving slowly or fidgety/restless 3 0 0  Suicidal thoughts 0 0 0  PHQ-9 Score _5 Difficult doing work/chores Somewhat difficult Somewhat difficult -    History Kelly Morrison has a past medical history of Attention deficit disorder and IBS (irritable bowel syndrome).   She has a past surgical history that includes Knee arthroscopy with posterior cruciate ligament (pcl) reconstruction (Right, 2012); Tonsillectomy (2016); and attention deficit disoder.   Her family history includes ADD / ADHD in her brother; Breast cancer in her mother; Cancer in her brother, father, paternal grandfather, paternal grandmother, and sister; Depression in her brother and mother; Diabetes in her maternal grandmother; Stroke in her mother; Tourette syndrome in her brother.She reports that she is a non-smoker but has been exposed to tobacco smoke. She has never used smokeless tobacco. She reports that she drinks alcohol. She reports that she does not use drugs.    ROS Review of Systems  Constitutional: Negative.   HENT: Negative for congestion.   Eyes: Negative for visual disturbance.  Respiratory: Negative for shortness of breath.   Cardiovascular: Negative for chest pain.  Gastrointestinal: Negative for abdominal pain, constipation, diarrhea, nausea and vomiting.  Genitourinary:  Negative for difficulty urinating.  Musculoskeletal: Negative for arthralgias and myalgias.  Neurological: Negative for headaches.  Psychiatric/Behavioral: Negative for sleep disturbance.    Objective:  BP 129/85   Pulse 88   Temp 98.9 F (37.2 C) (Oral)   Ht _6  (1.651 m)   Wt 208 lb 12.8 oz (94.7 kg)   BMI 34.75 kg/m   BP Readings from Last 3 Encounters:  09/13/18 129/85  06/10/17 110/60  05/19/17 120/80    Wt Readings from Last 3 Encounters:  09/13/18 208 lb 12.8 oz (94.7 kg)  06/10/17 227 lb (103 kg)  05/19/17 228 lb (103.4 kg)     Physical Exam  Constitutional: She is oriented to person, place, and time. She appears well-developed and well-nourished. No distress.  HENT:  Head: Normocephalic and atraumatic.  Right Ear: External ear normal.  Left Ear: External ear normal.  Nose: Nose normal.  Mouth/Throat: Oropharynx is clear and moist.  Eyes: Pupils are equal, round, and reactive to light. Conjunctivae and EOM are normal.  Neck: Normal range of motion. Neck supple. No thyromegaly present.  Cardiovascular: Normal rate, regular rhythm and normal heart sounds.  No murmur heard. Pulmonary/Chest: Effort normal and breath sounds normal. No respiratory distress. She has no wheezes. She has no rales. Right breast exhibits no inverted nipple, no mass and no tenderness. Left breast exhibits no inverted nipple, no mass and no tenderness. Breasts are symmetrical.  Abdominal: Soft. Normal appearance and bowel sounds are normal. She exhibits no distension, no abdominal bruit and no  mass. There is no splenomegaly or hepatomegaly. There is no tenderness. There is no tenderness at McBurney's point and negative Murphy's sign.  Genitourinary: Pelvic exam was performed with patient supine. There is no rash, tenderness or lesion on the right labia. There is no rash, tenderness or lesion on the left labia. Cervix exhibits no motion tenderness and no friability. Right adnexum displays no  mass. Left adnexum displays no mass. No foreign body in the vagina. Vaginal discharge found.  Musculoskeletal: Normal range of motion. She exhibits no edema or tenderness.  Lymphadenopathy:    She has no cervical adenopathy.  Neurological: She is alert and oriented to person, place, and time. She has normal reflexes.  Skin: Skin is warm and dry. No rash noted.  Psychiatric: She has a normal mood and affect. Her behavior is normal. Judgment and thought content normal.      Assessment & Plan:   Kelly Morrison was seen today for annual exam and std check.  Diagnoses and all orders for this visit:  Encounter for other contraceptive management -     GC/Chlamydia Probe Amp([facility]) -     HIV Antibody (routine testing w rflx) -     STD Screen (8) -     CBC with Differential/Platelet -     CMP14+EGFR -     Lipid panel -     Pap IG, CT/NG w/ reflex HPV when ASC-U -     WET PREP FOR [place], YEAST, CLUE  Screen for STD (sexually transmitted disease) -     GC/Chlamydia Probe Amp([facility]) -     HIV Antibody (routine testing w rflx) -     STD Screen (8) -     CBC with Differential/Platelet -     CMP14+EGFR -     Lipid panel -     Pap IG, CT/NG w/ reflex HPV when ASC-U -     WET PREP FOR [place], YEAST, CLUE  Encounter for well woman exam with routine gynecological exam -     GC/Chlamydia Probe Amp([facility]) -     HIV Antibody (routine testing w rflx) -     STD Screen (8) -     CBC with Differential/Platelet -     CMP14+EGFR -     Lipid panel -     Pap IG, CT/NG w/ reflex HPV when ASC-U -     WET PREP FOR [place]ICH, YEAST, CLUE  Need for immunization against influenza -     Flu Vaccine QUAD 36+ mos IM  Other orders -     citalopram (CELEXA) 20 MG tablet; Take 1 tablet (20 mg total) by mouth daily. -     HSV-2 IgG Supplemental Test       I have discontinued Kelly Morrison's promethazine-dextromethorphan, citalopram, and cloNIDine. I am also having her start on citalopram. Additionally,  I am having her maintain her etonogestrel.  Allergies as of 09/13/2018      Reactions   Cefprozil Hives, Rash   Cefzil   Latex Rash   Latex gloves      Medication List        Accurate as of 09/13/18 11:59 PM. Always use your most recent med list.          citalopram 20 MG tablet Commonly known as:  CELEXA Take 1 tablet (20 mg total) by mouth daily.   NEXPLANON 68 MG Impl implant Generic drug:  etonogestrel 1 each by Subdermal route once.  Follow-up: Return in about 6 months (around 03/14/2019).  Claretta Fraise, M.D.

## 2018-09-16 LAB — PAP IG, CT-NG, RFX HPV ASCU
CHLAMYDIA, NUC. ACID AMP: NEGATIVE
Gonococcus by Nucleic Acid Amp: NEGATIVE

## 2018-10-18 ENCOUNTER — Ambulatory Visit: Payer: Medicaid Other | Admitting: Family Medicine

## 2018-10-19 ENCOUNTER — Encounter: Payer: Self-pay | Admitting: Family Medicine

## 2018-12-22 ENCOUNTER — Encounter: Payer: Self-pay | Admitting: Adult Health

## 2018-12-22 ENCOUNTER — Ambulatory Visit (INDEPENDENT_AMBULATORY_CARE_PROVIDER_SITE_OTHER): Payer: Medicaid Other | Admitting: Adult Health

## 2018-12-22 VITALS — BP 120/77 | HR 87 | Ht 65.0 in | Wt 217.0 lb

## 2018-12-22 DIAGNOSIS — R109 Unspecified abdominal pain: Secondary | ICD-10-CM | POA: Diagnosis not present

## 2018-12-22 DIAGNOSIS — Z975 Presence of (intrauterine) contraceptive device: Secondary | ICD-10-CM

## 2018-12-22 DIAGNOSIS — R110 Nausea: Secondary | ICD-10-CM | POA: Diagnosis not present

## 2018-12-22 DIAGNOSIS — Z3202 Encounter for pregnancy test, result negative: Secondary | ICD-10-CM | POA: Diagnosis not present

## 2018-12-22 LAB — POCT URINE PREGNANCY: Preg Test, Ur: NEGATIVE

## 2018-12-22 MED ORDER — PRENATAL PLUS 27-1 MG PO TABS: 1.0000 | ORAL_TABLET | Freq: Every day | ORAL | 12 refills | Status: DC

## 2018-12-22 NOTE — Progress Notes (Signed)
Patient ID: Kelly Morrison, female   DOB: [birth year], 23 y.o.   MRN: [id number] History of Present Illness: Kelly Morrison is a 23 year old white female in complaining of stomach pain, has nexplanon, and wants it out, would like to get pregnant.  PCP is Dr Darlyn Read   Current Medications, Allergies, Past Medical History, Past Surgical History, Family History and Social History were reviewed in [employer] electronic medical record.     Review of Systems: +stomach pain Occasional nausea and vomiting Periods irregular with nexplanon  Desires pregnancy     Physical Exam:BP 120/77 (BP Location: Left Arm, Patient Position: Sitting, Cuff Size: Normal)   Pulse 87   Ht 5\' 5"  (1.651 m)   Wt 217 lb (98.4 kg)   LMP 09/28/2018   BMI 36.11 kg/m UPT is negative. General:  Well developed, well nourished, no acute distress Skin:  Warm and dry Neck:  Midline trachea, normal thyroid, good ROM, no lymphadenopathy Lungs; Clear to auscultation bilaterally Cardiovascular: Regular rate and rhythm Abdomen:  Soft,mildly tender, globally, no hepatosplenomegaly Psych:  No mood changes, alert and cooperative,seems happy Fall risk is low. Will rx PNV and get back in for nexplanon removal, declines meds for nausea.  Impression: 1. Stomach pain   2. Nexplanon in place   3. Pregnancy test negative   4. Nausea       Plan:  Meds ordered this encounter  Medications  . prenatal vitamin w/FE, FA (PRENATAL 1 + 1) 27-1 MG TABS tablet    Sig: Take 1 tablet by mouth daily at 12 noon.    Dispense:  30 each    Refill:  12    Order Specific Question:   Supervising Provider    Answer:   Lazaro Arms [[id number]]   Return 3/4 for nexplanon removal

## 2019-01-02 ENCOUNTER — Telehealth: Payer: Self-pay | Admitting: *Deleted

## 2019-01-02 NOTE — Telephone Encounter (Signed)
Patient called canceling her appointment on 01/04/19 patient wanted to know if she leaves her inplant in there for now will it hurt her. Please advise

## 2019-01-02 NOTE — Telephone Encounter (Signed)
Attempted to return pt's call.

## 2019-01-03 ENCOUNTER — Telehealth: Payer: Self-pay | Admitting: *Deleted

## 2019-01-03 NOTE — Telephone Encounter (Signed)
Pt called concerned that her nexplanon is past due to come out and that it may cause harm to her if left in. Advised pt that her nexplanon was placed in 06/2017 and it should remain effective until 06/2020. She states that she has an appt next week to have it removed. Advised that she has at least another year that it can stay in and she could cancel that appt if she desired. Pt states she will talk to her boyfriend, as they may want to have a baby.

## 2019-01-04 ENCOUNTER — Encounter: Payer: Self-pay | Admitting: Adult Health

## 2019-01-05 ENCOUNTER — Encounter: Payer: Self-pay | Admitting: Women's Health

## 2019-04-11 IMAGING — DX DG CHEST 2V
2 series · 2 of 2 positions shown · non-contrast
Comparison: PA and lateral chest 11/14/2009.

CLINICAL DATA: Productive cough and congestion since yesterday.

EXAM:
CHEST  2 VIEW

[chest pa]
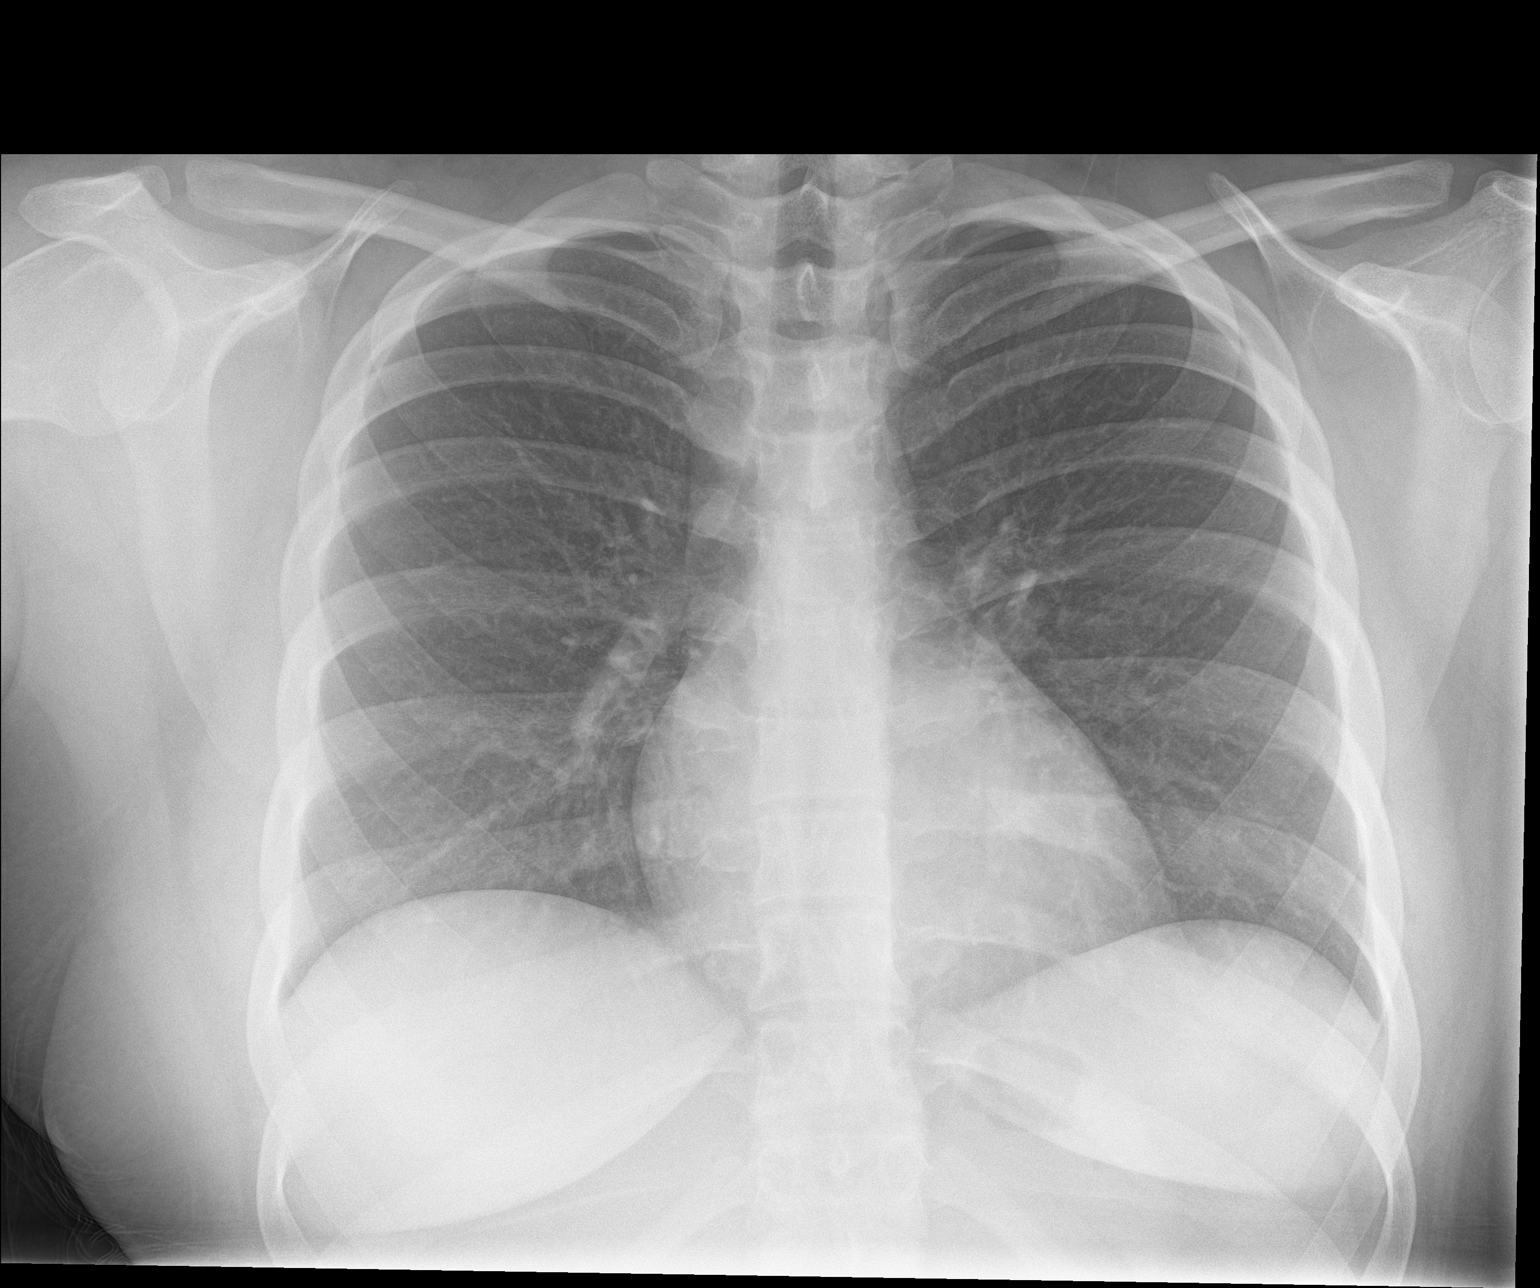

[chest lat]
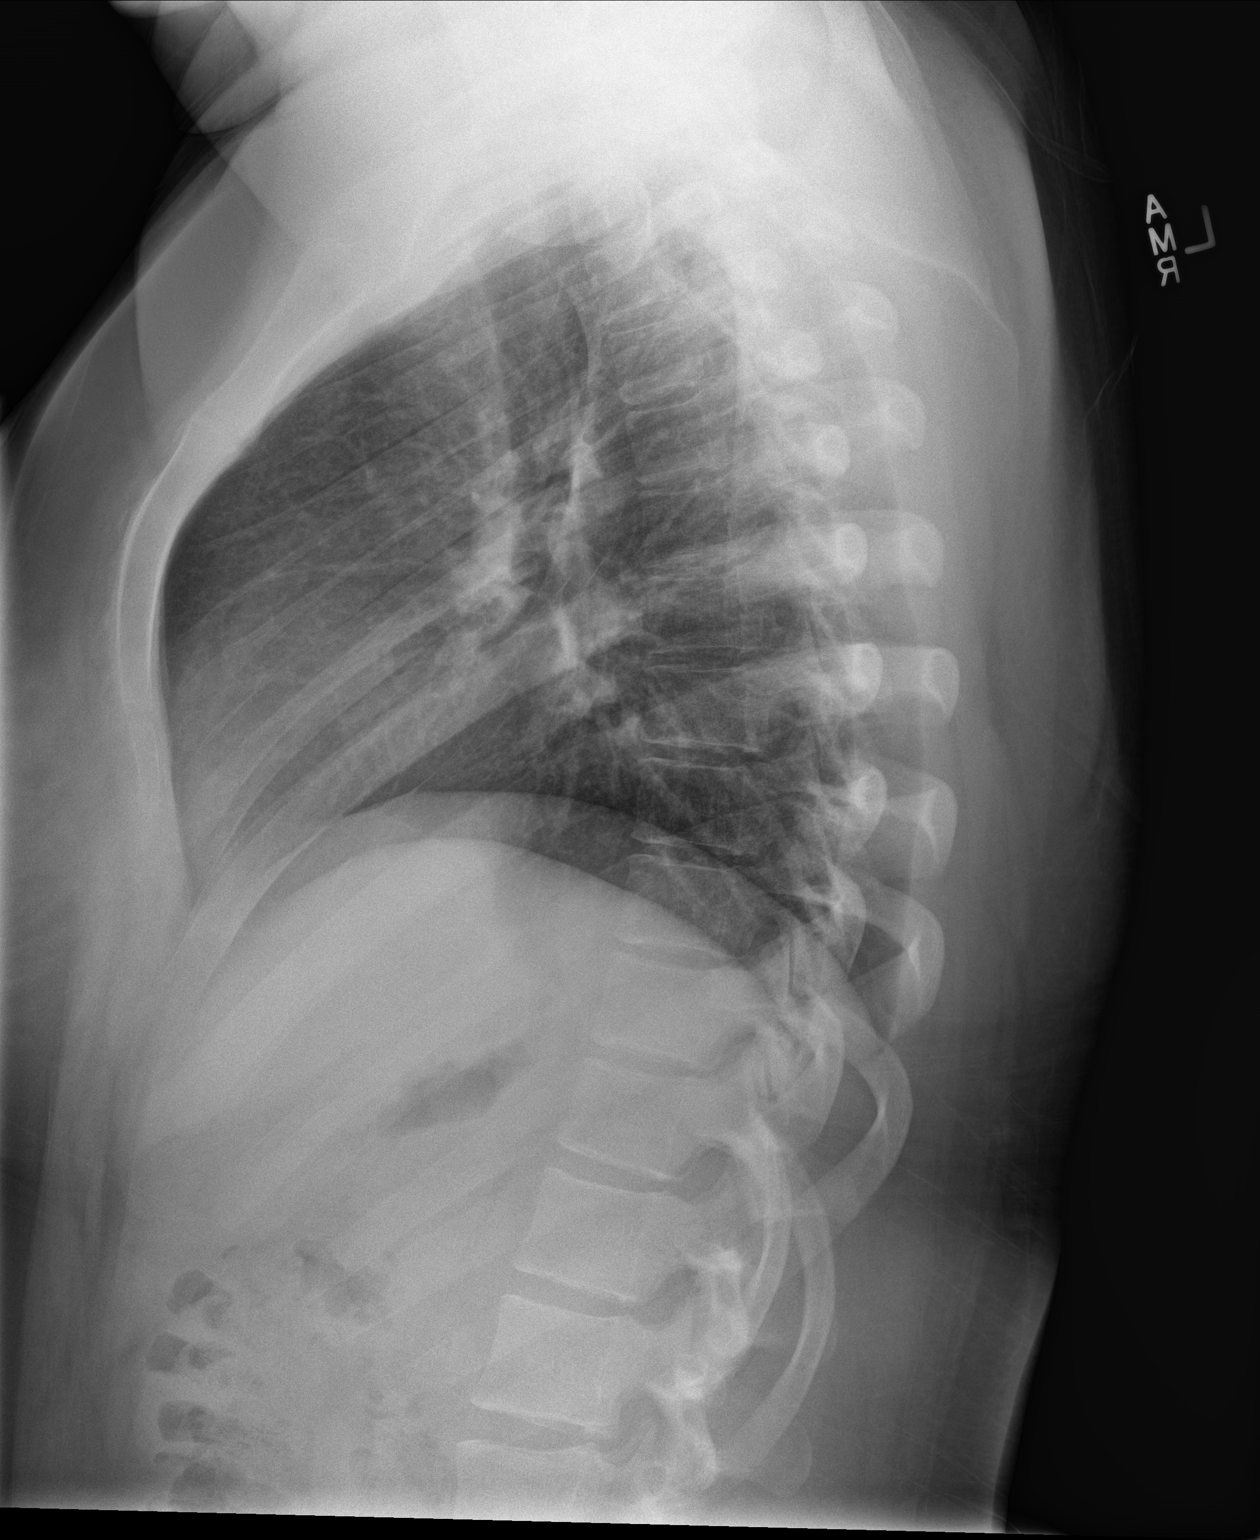

[2 of 2 positions shown; findings below may reference images not displayed]

FINDINGS: The lungs are clear. Heart size is normal. No pneumothorax or
pleural effusion. No bony abnormality.
IMPRESSION: Normal chest.

## 2019-05-03 ENCOUNTER — Ambulatory Visit: Payer: Medicaid Other | Admitting: Advanced Practice Midwife

## 2019-05-03 ENCOUNTER — Encounter: Payer: Medicaid Other | Admitting: Advanced Practice Midwife

## 2019-05-11 ENCOUNTER — Ambulatory Visit: Payer: Medicaid Other | Admitting: Advanced Practice Midwife

## 2019-05-11 ENCOUNTER — Other Ambulatory Visit: Payer: Self-pay

## 2019-05-11 ENCOUNTER — Encounter: Payer: Self-pay | Admitting: Advanced Practice Midwife

## 2019-05-11 VITALS — BP 118/83 | HR 80 | Ht 65.0 in | Wt 235.0 lb

## 2019-05-11 DIAGNOSIS — Z3046 Encounter for surveillance of implantable subdermal contraceptive: Secondary | ICD-10-CM

## 2019-05-11 MED ORDER — PNV PRENATAL PLUS MULTIVITAMIN 27-1 MG PO TABS: 1.0000 | ORAL_TABLET | Freq: Every day | ORAL | 11 refills | Status: AC

## 2019-05-11 NOTE — Progress Notes (Signed)
HPI:  Kelly Morrison 23 y.o. here for Nexplanon removal.  Her future plans for birth control are nothing--wats to get pregnnat.  Past Medical History: Past Medical History:  Diagnosis Date  . Attention deficit disorder    DCed vyvanse at age 61  . IBS (irritable bowel syndrome)     Past Surgical History: Past Surgical History:  Procedure Laterality Date  . attention deficit disoder     stopped vyvanse 30 mg when she turned 18  . KNEE ARTHROSCOPY WITH POSTERIOR CRUCIATE LIGAMENT (PCL) RECONSTRUCTION Right 2012  . TONSILLECTOMY  2016    Family History: Family History  Problem Relation Age of Onset  . Stroke Mother   . Breast cancer Mother   . Depression Mother   . Cancer Father   . Cancer Sister   . Depression Brother   . Tourette syndrome Brother   . ADD / ADHD Brother   . Cancer Brother   . Cancer Paternal Grandfather   . Cancer Paternal Grandmother   . Diabetes Maternal Grandmother     Social History: Social History   Tobacco Use  . Smoking status: Passive Smoke Exposure - Never Smoker  . Smokeless tobacco: Never Used  Substance Use Topics  . Alcohol use: Not Currently    Alcohol/week: 0.0 standard drinks  . Drug use: No    Allergies:  Allergies  Allergen Reactions  . Cefprozil Hives and Rash    Cefzil  . Latex Rash    Latex gloves    Meds: (Not in a hospital admission)     Patient given informed consent for removal of her Nexplanon, time out was performed.  Signed copy in the chart.  Appropriate time out taken. Implanon site identified.  Area prepped in usual sterile fashon. One cc of 1% lidocaine was used to anesthetize the area at the distal end of the implant. A small stab incision was made right beside the implant on the distal portion.  The Nexplanon rod was grasped using hemostats and removed without difficulty.  There was less than 3 cc blood loss. There were no complications.  A small amount of antibiotic ointment and steri-strips were  applied over the small incision.  A pressure bandage was applied to reduce any bruising.  The patient tolerated the procedure well and was given post procedure instructions. rx PNV start now

## 2019-06-26 ENCOUNTER — Other Ambulatory Visit: Payer: Self-pay

## 2019-06-26 ENCOUNTER — Ambulatory Visit (INDEPENDENT_AMBULATORY_CARE_PROVIDER_SITE_OTHER): Payer: Medicaid Other | Admitting: Women's Health

## 2019-06-26 ENCOUNTER — Encounter: Payer: Self-pay | Admitting: Women's Health

## 2019-06-26 VITALS — BP 124/84 | HR 90 | Ht 65.0 in | Wt 230.0 lb

## 2019-06-26 DIAGNOSIS — N7011 Chronic salpingitis: Secondary | ICD-10-CM

## 2019-06-26 DIAGNOSIS — R1032 Left lower quadrant pain: Secondary | ICD-10-CM

## 2019-06-26 DIAGNOSIS — A749 Chlamydial infection, unspecified: Secondary | ICD-10-CM

## 2019-06-26 NOTE — Progress Notes (Signed)
   GYN VISIT Patient name: Kelly Morrison MRN [id number]  Date of birth: Dec 28, 1995 Chief Complaint:   was told left fallopian tube was swelled with fluid (went to [facility] ER 2 weeks ago)  History of Present Illness:   Kelly Morrison is a 23 y.o. G0P0000 Caucasian female being seen today for ED f/u.  Nexplanon removed 7/9, wants to get pregnant. Started period 7/19. Went to [facility] 8/6 w/ severe LLQ pain, pelvic u/s revealed 1cm Lt hydrosalpinx, abd CT neg. +BV, yeast and chlamydia. States her and partner both took meds, waited 2wks to have sex. Pain has resolved. Only has period type cramping w/ period, which started again 8/21. Taking pnv.  Patient's last menstrual period was 05/21/2019. The current method of family planning is none.  Last pap 09/13/18. Results were:  normal w/ -gc/ct Review of Systems:   Pertinent items are noted in HPI Denies fever/chills, dizziness, headaches, visual disturbances, fatigue, shortness of breath, chest pain, abdominal pain, vomiting, abnormal vaginal discharge/itching/odor/irritation, problems with periods, bowel movements, urination, or intercourse unless otherwise stated above.  Pertinent History Reviewed:  Reviewed past medical,surgical, social, obstetrical and family history.  Reviewed problem list, medications and allergies. Physical Assessment:   Vitals:   06/26/19 1453  BP: 124/84  Pulse: 90  Weight: 230 lb (104.3 kg)  Height: 5\' 5"  (1.651 m)  Body mass index is 38.27 kg/m.       Physical Examination:   General appearance: alert, well appearing, and in no distress  Mental status: alert, oriented to person, place, and time  Skin: warm & dry   Cardiovascular: normal heart rate noted  Respiratory: normal respiratory effort, no distress  Abdomen: soft, non-tender   Pelvic: examination not indicated  Extremities: no edema   No results found for this or any previous visit (from the past 24 hour(s)).  Assessment & Plan:  1) Resolved LLQ  pain  2) Lt hydrosalpinx> w/ +CT, tx 2.5wks ago, lives in [place], will go ahead and send POC today, if still positive will just retest in few weeks.   3) Desire for pregnancy> continue pnv, let us know when gets +PT  4) Recent CT> poc today  Meds: No orders of the defined types were placed in this encounter.   Orders Placed This Encounter  Procedures  . GC/Chlamydia Probe Amp    Return for prn.  [place], [facility] 06/26/2019 3:39 PM

## 2019-06-28 LAB — GC/CHLAMYDIA PROBE AMP
Chlamydia trachomatis, NAA: NEGATIVE
Neisseria Gonorrhoeae by PCR: NEGATIVE

## 2019-09-13 ENCOUNTER — Ambulatory Visit: Payer: Medicaid Other | Admitting: Adult Health

## 2019-10-31 DIAGNOSIS — O26891 Other specified pregnancy related conditions, first trimester: Secondary | ICD-10-CM | POA: Diagnosis not present

## 2019-10-31 DIAGNOSIS — Z3A01 Less than 8 weeks gestation of pregnancy: Secondary | ICD-10-CM | POA: Diagnosis not present

## 2019-11-07 DIAGNOSIS — Z3401 Encounter for supervision of normal first pregnancy, first trimester: Secondary | ICD-10-CM | POA: Diagnosis not present

## 2019-11-09 DIAGNOSIS — O99211 Obesity complicating pregnancy, first trimester: Secondary | ICD-10-CM | POA: Diagnosis not present

## 2019-11-09 DIAGNOSIS — Z3401 Encounter for supervision of normal first pregnancy, first trimester: Secondary | ICD-10-CM | POA: Diagnosis not present

## 2019-11-27 DIAGNOSIS — Z01419 Encounter for gynecological examination (general) (routine) without abnormal findings: Secondary | ICD-10-CM | POA: Diagnosis not present

## 2019-11-27 DIAGNOSIS — Z118 Encounter for screening for other infectious and parasitic diseases: Secondary | ICD-10-CM | POA: Diagnosis not present

## 2019-11-27 DIAGNOSIS — Z113 Encounter for screening for infections with a predominantly sexual mode of transmission: Secondary | ICD-10-CM | POA: Diagnosis not present

## 2019-12-13 DIAGNOSIS — Z3682 Encounter for antenatal screening for nuchal translucency: Secondary | ICD-10-CM | POA: Diagnosis not present

## 2019-12-13 DIAGNOSIS — Z36 Encounter for antenatal screening for chromosomal anomalies: Secondary | ICD-10-CM | POA: Diagnosis not present

## 2020-01-23 DIAGNOSIS — Z3A18 18 weeks gestation of pregnancy: Secondary | ICD-10-CM | POA: Diagnosis not present

## 2020-01-23 DIAGNOSIS — O210 Mild hyperemesis gravidarum: Secondary | ICD-10-CM | POA: Diagnosis not present

## 2020-01-30 DIAGNOSIS — Z363 Encounter for antenatal screening for malformations: Secondary | ICD-10-CM | POA: Diagnosis not present

## 2020-02-16 ENCOUNTER — Emergency Department (HOSPITAL_COMMUNITY)
Admission: EM | Admit: 2020-02-16 | Discharge: 2020-02-17 | Disposition: A | Discharge status: Home / Self Care | Payer: Medicaid Other | Attending: Emergency Medicine | Admitting: Emergency Medicine

## 2020-02-16 ENCOUNTER — Other Ambulatory Visit: Payer: Self-pay

## 2020-02-16 ENCOUNTER — Encounter (HOSPITAL_COMMUNITY): Payer: Self-pay | Admitting: Pediatrics

## 2020-02-16 DIAGNOSIS — O26892 Other specified pregnancy related conditions, second trimester: Secondary | ICD-10-CM | POA: Diagnosis not present

## 2020-02-16 DIAGNOSIS — O99891 Other specified diseases and conditions complicating pregnancy: Secondary | ICD-10-CM | POA: Diagnosis not present

## 2020-02-16 DIAGNOSIS — Z5321 Procedure and treatment not carried out due to patient leaving prior to being seen by health care provider: Secondary | ICD-10-CM | POA: Insufficient documentation

## 2020-02-16 DIAGNOSIS — G932 Benign intracranial hypertension: Secondary | ICD-10-CM | POA: Diagnosis not present

## 2020-02-16 DIAGNOSIS — O99352 Diseases of the nervous system complicating pregnancy, second trimester: Secondary | ICD-10-CM | POA: Diagnosis not present

## 2020-02-16 DIAGNOSIS — Z3A22 22 weeks gestation of pregnancy: Secondary | ICD-10-CM | POA: Diagnosis not present

## 2020-02-16 DIAGNOSIS — H318 Other specified disorders of choroid: Secondary | ICD-10-CM | POA: Diagnosis not present

## 2020-02-16 DIAGNOSIS — I10 Essential (primary) hypertension: Secondary | ICD-10-CM | POA: Insufficient documentation

## 2020-02-16 DIAGNOSIS — O1043 Pre-existing secondary hypertension complicating the puerperium: Secondary | ICD-10-CM | POA: Diagnosis not present

## 2020-02-16 DIAGNOSIS — H4710 Unspecified papilledema: Secondary | ICD-10-CM | POA: Diagnosis not present

## 2020-02-16 DIAGNOSIS — M549 Dorsalgia, unspecified: Secondary | ICD-10-CM | POA: Diagnosis not present

## 2020-02-16 LAB — CBC WITH DIFFERENTIAL/PLATELET
Abs Immature Granulocytes: 0.1 10*3/uL — ABNORMAL HIGH (ref 0.00–0.07)
Basophils Absolute: 0 10*3/uL (ref 0.0–0.1)
Basophils Relative: 0 %
Eosinophils Absolute: 0.4 10*3/uL (ref 0.0–0.5)
Eosinophils Relative: 4 %
HCT: 35.3 % — ABNORMAL LOW (ref 36.0–46.0)
Hemoglobin: 11.6 g/dL — ABNORMAL LOW (ref 12.0–15.0)
Immature Granulocytes: 1 %
Lymphocytes Relative: 16 %
Lymphs Abs: 1.7 10*3/uL (ref 0.7–4.0)
MCH: 29.7 pg (ref 26.0–34.0)
MCHC: 32.9 g/dL (ref 30.0–36.0)
MCV: 90.5 fL (ref 80.0–100.0)
Monocytes Absolute: 0.5 10*3/uL (ref 0.1–1.0)
Monocytes Relative: 5 %
Neutro Abs: 7.8 10*3/uL — ABNORMAL HIGH (ref 1.7–7.7)
Neutrophils Relative %: 74 %
Platelets: 253 10*3/uL (ref 150–400)
RBC: 3.9 MIL/uL (ref 3.87–5.11)
RDW: 14.2 % (ref 11.5–15.5)
WBC: 10.5 10*3/uL (ref 4.0–10.5)
nRBC: 0 % (ref 0.0–0.2)

## 2020-02-16 LAB — COMPREHENSIVE METABOLIC PANEL
ALT: 20 U/L (ref 0–44)
AST: 25 U/L (ref 15–41)
Albumin: 3.1 g/dL — ABNORMAL LOW (ref 3.5–5.0)
Alkaline Phosphatase: 51 U/L (ref 38–126)
Anion gap: 10 (ref 5–15)
BUN: <5 mg/dL — ABNORMAL LOW (ref 6–20)
CO2: 21 mmol/L — ABNORMAL LOW (ref 22–32)
Calcium: 8.8 mg/dL — ABNORMAL LOW (ref 8.9–10.3)
Chloride: 108 mmol/L (ref 98–111)
Creatinine, Ser: 0.49 mg/dL (ref 0.44–1.00)
GFR calc Af Amer: >60 mL/min (ref >60)
GFR calc non Af Amer: >60 mL/min (ref >60)
Glucose, Bld: 118 mg/dL — ABNORMAL HIGH (ref 70–99)
Potassium: 3.7 mmol/L (ref 3.5–5.1)
Sodium: 139 mmol/L (ref 135–145)
Total Bilirubin: 0.4 mg/dL (ref 0.3–1.2)
Total Protein: 6.1 g/dL — ABNORMAL LOW (ref 6.5–8.1)

## 2020-02-16 NOTE — ED Triage Notes (Signed)
Patient stated she was referred by eye doctors concern for idiopathic intracranial hypertension. Reported she is [redacted] weeks pregnant; stated she was seeing the eye doctors d/t floaters;

## 2020-02-16 NOTE — ED Notes (Signed)
Pt asked this tech how long wait was, this tech explained wait and ED process. Pt was encouraged to stay, pt decided to leave.

## 2020-02-17 DIAGNOSIS — H4710 Unspecified papilledema: Secondary | ICD-10-CM | POA: Diagnosis not present

## 2020-02-17 DIAGNOSIS — O99891 Other specified diseases and conditions complicating pregnancy: Secondary | ICD-10-CM | POA: Diagnosis not present

## 2020-02-17 DIAGNOSIS — O99352 Diseases of the nervous system complicating pregnancy, second trimester: Secondary | ICD-10-CM | POA: Diagnosis not present

## 2020-02-17 DIAGNOSIS — O26892 Other specified pregnancy related conditions, second trimester: Secondary | ICD-10-CM | POA: Diagnosis not present

## 2020-02-17 DIAGNOSIS — M549 Dorsalgia, unspecified: Secondary | ICD-10-CM | POA: Diagnosis not present

## 2020-02-17 DIAGNOSIS — Z3A22 22 weeks gestation of pregnancy: Secondary | ICD-10-CM | POA: Diagnosis not present

## 2020-02-17 DIAGNOSIS — O1043 Pre-existing secondary hypertension complicating the puerperium: Secondary | ICD-10-CM | POA: Diagnosis not present

## 2020-02-17 DIAGNOSIS — G932 Benign intracranial hypertension: Secondary | ICD-10-CM | POA: Diagnosis not present

## 2020-02-21 DIAGNOSIS — R519 Headache, unspecified: Secondary | ICD-10-CM | POA: Diagnosis not present

## 2020-02-21 DIAGNOSIS — Z3A22 22 weeks gestation of pregnancy: Secondary | ICD-10-CM | POA: Diagnosis not present

## 2020-02-21 DIAGNOSIS — G932 Benign intracranial hypertension: Secondary | ICD-10-CM | POA: Diagnosis not present

## 2020-02-21 DIAGNOSIS — Z3490 Encounter for supervision of normal pregnancy, unspecified, unspecified trimester: Secondary | ICD-10-CM | POA: Diagnosis not present

## 2020-02-28 DIAGNOSIS — G932 Benign intracranial hypertension: Secondary | ICD-10-CM | POA: Diagnosis not present

## 2020-03-08 DIAGNOSIS — M25571 Pain in right ankle and joints of right foot: Secondary | ICD-10-CM | POA: Diagnosis not present

## 2020-03-08 DIAGNOSIS — S93401D Sprain of unspecified ligament of right ankle, subsequent encounter: Secondary | ICD-10-CM | POA: Diagnosis not present

## 2020-03-08 DIAGNOSIS — Q6689 Other  specified congenital deformities of feet: Secondary | ICD-10-CM | POA: Diagnosis not present

## 2020-03-08 DIAGNOSIS — Q688 Other specified congenital musculoskeletal deformities: Secondary | ICD-10-CM | POA: Diagnosis not present

## 2020-03-08 DIAGNOSIS — M7731 Calcaneal spur, right foot: Secondary | ICD-10-CM | POA: Diagnosis not present

## 2020-03-27 DIAGNOSIS — O133 Gestational [pregnancy-induced] hypertension without significant proteinuria, third trimester: Secondary | ICD-10-CM | POA: Diagnosis not present

## 2020-03-27 DIAGNOSIS — Z23 Encounter for immunization: Secondary | ICD-10-CM | POA: Diagnosis not present

## 2020-03-27 DIAGNOSIS — O99213 Obesity complicating pregnancy, third trimester: Secondary | ICD-10-CM | POA: Diagnosis not present

## 2020-03-27 DIAGNOSIS — Z3402 Encounter for supervision of normal first pregnancy, second trimester: Secondary | ICD-10-CM | POA: Diagnosis not present

## 2020-04-03 DIAGNOSIS — G932 Benign intracranial hypertension: Secondary | ICD-10-CM | POA: Diagnosis not present

## 2020-04-08 DIAGNOSIS — M545 Low back pain: Secondary | ICD-10-CM | POA: Diagnosis not present

## 2020-04-08 DIAGNOSIS — Z3A29 29 weeks gestation of pregnancy: Secondary | ICD-10-CM | POA: Diagnosis not present

## 2020-04-10 DIAGNOSIS — O99810 Abnormal glucose complicating pregnancy: Secondary | ICD-10-CM | POA: Diagnosis not present

## 2020-04-10 DIAGNOSIS — G932 Benign intracranial hypertension: Secondary | ICD-10-CM | POA: Diagnosis not present

## 2020-04-10 DIAGNOSIS — Z331 Pregnant state, incidental: Secondary | ICD-10-CM | POA: Diagnosis not present

## 2020-04-10 DIAGNOSIS — O99353 Diseases of the nervous system complicating pregnancy, third trimester: Secondary | ICD-10-CM | POA: Diagnosis not present

## 2020-04-10 DIAGNOSIS — Z79899 Other long term (current) drug therapy: Secondary | ICD-10-CM | POA: Diagnosis not present

## 2020-04-10 DIAGNOSIS — O2603 Excessive weight gain in pregnancy, third trimester: Secondary | ICD-10-CM | POA: Diagnosis not present

## 2020-04-10 DIAGNOSIS — O99213 Obesity complicating pregnancy, third trimester: Secondary | ICD-10-CM | POA: Diagnosis not present

## 2020-04-10 DIAGNOSIS — Z3A29 29 weeks gestation of pregnancy: Secondary | ICD-10-CM | POA: Diagnosis not present

## 2020-04-11 DIAGNOSIS — O99213 Obesity complicating pregnancy, third trimester: Secondary | ICD-10-CM | POA: Diagnosis not present

## 2020-04-14 DIAGNOSIS — O99891 Other specified diseases and conditions complicating pregnancy: Secondary | ICD-10-CM | POA: Diagnosis not present

## 2020-04-14 DIAGNOSIS — Z3A30 30 weeks gestation of pregnancy: Secondary | ICD-10-CM | POA: Diagnosis not present

## 2020-04-14 DIAGNOSIS — Z608 Other problems related to social environment: Secondary | ICD-10-CM | POA: Diagnosis not present

## 2020-04-14 DIAGNOSIS — O4703 False labor before 37 completed weeks of gestation, third trimester: Secondary | ICD-10-CM | POA: Diagnosis not present

## 2020-04-16 DIAGNOSIS — O24419 Gestational diabetes mellitus in pregnancy, unspecified control: Secondary | ICD-10-CM | POA: Diagnosis not present

## 2020-04-24 DIAGNOSIS — O99413 Diseases of the circulatory system complicating pregnancy, third trimester: Secondary | ICD-10-CM | POA: Diagnosis not present

## 2020-04-24 DIAGNOSIS — O4103X1 Oligohydramnios, third trimester, fetus 1: Secondary | ICD-10-CM | POA: Diagnosis not present

## 2020-04-24 DIAGNOSIS — R079 Chest pain, unspecified: Secondary | ICD-10-CM | POA: Diagnosis not present

## 2020-04-24 DIAGNOSIS — Z3A31 31 weeks gestation of pregnancy: Secondary | ICD-10-CM | POA: Diagnosis not present

## 2020-05-08 DIAGNOSIS — O4103X1 Oligohydramnios, third trimester, fetus 1: Secondary | ICD-10-CM | POA: Diagnosis not present

## 2020-05-16 DIAGNOSIS — M545 Low back pain: Secondary | ICD-10-CM | POA: Diagnosis not present

## 2020-05-16 DIAGNOSIS — Z3A34 34 weeks gestation of pregnancy: Secondary | ICD-10-CM | POA: Diagnosis not present

## 2020-05-22 DIAGNOSIS — O99213 Obesity complicating pregnancy, third trimester: Secondary | ICD-10-CM | POA: Diagnosis not present

## 2020-05-30 DIAGNOSIS — Z113 Encounter for screening for infections with a predominantly sexual mode of transmission: Secondary | ICD-10-CM | POA: Diagnosis not present

## 2020-05-30 DIAGNOSIS — Z3685 Encounter for antenatal screening for Streptococcus B: Secondary | ICD-10-CM | POA: Diagnosis not present

## 2020-05-30 DIAGNOSIS — Z3483 Encounter for supervision of other normal pregnancy, third trimester: Secondary | ICD-10-CM | POA: Diagnosis not present

## 2020-05-31 DIAGNOSIS — J302 Other seasonal allergic rhinitis: Secondary | ICD-10-CM | POA: Diagnosis not present

## 2020-05-31 DIAGNOSIS — H04209 Unspecified epiphora, unspecified lacrimal gland: Secondary | ICD-10-CM | POA: Diagnosis not present

## 2020-05-31 DIAGNOSIS — R0981 Nasal congestion: Secondary | ICD-10-CM | POA: Diagnosis not present

## 2020-05-31 DIAGNOSIS — R067 Sneezing: Secondary | ICD-10-CM | POA: Diagnosis not present

## 2020-06-02 DIAGNOSIS — R067 Sneezing: Secondary | ICD-10-CM | POA: Diagnosis not present

## 2020-06-02 DIAGNOSIS — G932 Benign intracranial hypertension: Secondary | ICD-10-CM | POA: Diagnosis not present

## 2020-06-02 DIAGNOSIS — Z3A37 37 weeks gestation of pregnancy: Secondary | ICD-10-CM | POA: Diagnosis not present

## 2020-06-02 DIAGNOSIS — Z20822 Contact with and (suspected) exposure to covid-19: Secondary | ICD-10-CM | POA: Diagnosis not present

## 2020-06-02 DIAGNOSIS — R0989 Other specified symptoms and signs involving the circulatory and respiratory systems: Secondary | ICD-10-CM | POA: Diagnosis not present

## 2020-06-02 DIAGNOSIS — O99512 Diseases of the respiratory system complicating pregnancy, second trimester: Secondary | ICD-10-CM | POA: Diagnosis not present

## 2020-06-02 DIAGNOSIS — J069 Acute upper respiratory infection, unspecified: Secondary | ICD-10-CM | POA: Diagnosis not present

## 2020-06-02 DIAGNOSIS — J029 Acute pharyngitis, unspecified: Secondary | ICD-10-CM | POA: Diagnosis not present

## 2020-06-03 DIAGNOSIS — O139 Gestational [pregnancy-induced] hypertension without significant proteinuria, unspecified trimester: Secondary | ICD-10-CM | POA: Diagnosis not present

## 2020-06-07 DIAGNOSIS — O24410 Gestational diabetes mellitus in pregnancy, diet controlled: Secondary | ICD-10-CM | POA: Diagnosis not present

## 2020-06-07 DIAGNOSIS — Z3A37 37 weeks gestation of pregnancy: Secondary | ICD-10-CM | POA: Diagnosis not present

## 2020-06-07 DIAGNOSIS — O113 Pre-existing hypertension with pre-eclampsia, third trimester: Secondary | ICD-10-CM | POA: Diagnosis not present

## 2020-07-19 DIAGNOSIS — Z130 Encounter for screening for diseases of the blood and blood-forming organs and certain disorders involving the immune mechanism: Secondary | ICD-10-CM | POA: Diagnosis not present
